# Patient Record
Sex: Female | Born: 1975 | Race: White | Hispanic: No | Marital: Single | Smoking: Current every day smoker
Health system: Southern US, Community
[De-identification: ages and names within clinical notes are randomized; demographics above are authoritative.]

## PROBLEM LIST (undated history)

## (undated) DIAGNOSIS — H6980 Other specified disorders of Eustachian tube, unspecified ear: Secondary | ICD-10-CM

## (undated) DIAGNOSIS — K219 Gastro-esophageal reflux disease without esophagitis: Secondary | ICD-10-CM

## (undated) DIAGNOSIS — J45909 Unspecified asthma, uncomplicated: Secondary | ICD-10-CM

## (undated) DIAGNOSIS — H6990 Unspecified Eustachian tube disorder, unspecified ear: Secondary | ICD-10-CM

## (undated) HISTORY — PX: CHOLECYSTECTOMY: SHX55

## (undated) HISTORY — DX: Unspecified asthma, uncomplicated: J45.909

## (undated) HISTORY — DX: Other specified disorders of Eustachian tube, unspecified ear: H69.80

## (undated) HISTORY — DX: Gastro-esophageal reflux disease without esophagitis: K21.9

## (undated) HISTORY — DX: Unspecified eustachian tube disorder, unspecified ear: H69.90

## (undated) HISTORY — PX: TUBAL LIGATION: SHX77

---

## 2007-02-01 ENCOUNTER — Ambulatory Visit: Payer: Self-pay | Admitting: General Surgery

## 2007-02-03 ENCOUNTER — Ambulatory Visit: Payer: Self-pay | Admitting: General Surgery

## 2007-02-11 ENCOUNTER — Emergency Department: Payer: Self-pay | Admitting: Emergency Medicine

## 2008-10-10 ENCOUNTER — Encounter
Admission: RE | Admit: 2008-10-10 | Discharge: 2008-10-10 | Payer: Self-pay | Admitting: Physical Medicine & Rehabilitation

## 2008-12-11 ENCOUNTER — Ambulatory Visit: Payer: Self-pay | Admitting: Family Medicine

## 2008-12-13 ENCOUNTER — Ambulatory Visit: Payer: Self-pay | Admitting: Family Medicine

## 2009-01-08 ENCOUNTER — Encounter: Payer: Self-pay | Admitting: Neurosurgery

## 2009-06-22 ENCOUNTER — Emergency Department: Payer: Self-pay | Admitting: Emergency Medicine

## 2012-06-24 ENCOUNTER — Emergency Department: Payer: Self-pay | Admitting: Internal Medicine

## 2012-06-24 LAB — COMPREHENSIVE METABOLIC PANEL
Albumin: 3.9 g/dL (ref 3.4–5.0)
Anion Gap: 8 (ref 7–16)
Calcium, Total: 9.2 mg/dL (ref 8.5–10.1)
Co2: 26 mmol/L (ref 21–32)
EGFR (African American): >60
EGFR (Non-African Amer.): >60
Glucose: 95 mg/dL (ref 65–99)
Osmolality: 282 (ref 275–301)
Potassium: 3.7 mmol/L (ref 3.5–5.1)
SGOT(AST): 21 U/L (ref 15–37)
Sodium: 143 mmol/L (ref 136–145)

## 2012-06-24 LAB — URINALYSIS, COMPLETE
Bacteria: NONE SEEN
Bilirubin,UR: NEGATIVE
Blood: NEGATIVE
RBC,UR: 3 /HPF (ref 0–5)
Squamous Epithelial: 16
WBC UR: 20 /HPF (ref 0–5)

## 2012-06-24 LAB — CBC
HCT: 45.3 % (ref 35.0–47.0)
MCV: 96 fL (ref 80–100)
RBC: 4.73 10*6/uL (ref 3.80–5.20)
RDW: 12.7 % (ref 11.5–14.5)
WBC: 15.7 10*3/uL — ABNORMAL HIGH (ref 3.6–11.0)

## 2012-06-25 ENCOUNTER — Ambulatory Visit: Payer: Self-pay | Admitting: Internal Medicine

## 2012-06-25 LAB — URINALYSIS, COMPLETE
Bilirubin,UR: NEGATIVE
Ketone: NEGATIVE
Protein: NEGATIVE
Specific Gravity: 1.01 (ref 1.003–1.030)

## 2013-12-12 ENCOUNTER — Encounter: Payer: Self-pay | Admitting: Podiatry

## 2013-12-12 ENCOUNTER — Ambulatory Visit (INDEPENDENT_AMBULATORY_CARE_PROVIDER_SITE_OTHER): Payer: Medicaid Other

## 2013-12-12 ENCOUNTER — Ambulatory Visit (INDEPENDENT_AMBULATORY_CARE_PROVIDER_SITE_OTHER): Payer: Medicaid Other | Admitting: Podiatry

## 2013-12-12 VITALS — BP 121/85 | HR 84 | Resp 16 | Ht 61.0 in | Wt 162.0 lb

## 2013-12-12 DIAGNOSIS — M79609 Pain in unspecified limb: Secondary | ICD-10-CM

## 2013-12-12 DIAGNOSIS — M722 Plantar fascial fibromatosis: Secondary | ICD-10-CM

## 2013-12-12 DIAGNOSIS — M79673 Pain in unspecified foot: Secondary | ICD-10-CM

## 2013-12-12 MED ORDER — TRIAMCINOLONE ACETONIDE 10 MG/ML IJ SUSP: 10.0000 mg | Freq: Once | INTRAMUSCULAR | Status: DC

## 2013-12-12 NOTE — Patient Instructions (Signed)
Plantar Fasciitis (Heel Spur Syndrome)  with Rehab  The plantar fascia is a fibrous, ligament-like, soft-tissue structure that spans the bottom of the foot. Plantar fasciitis is a condition that causes pain in the foot due to inflammation of the tissue.  SYMPTOMS   · Pain and tenderness on the underneath side of the foot.  · Pain that worsens with standing or walking.  CAUSES   Plantar fasciitis is caused by irritation and injury to the plantar fascia on the underneath side of the foot. Common mechanisms of injury include:  · Direct trauma to bottom of the foot.  · Damage to a small nerve that runs under the foot where the main fascia attaches to the heel bone.  · Stress placed on the plantar fascia due to bone spurs.  RISK INCREASES WITH:   · Activities that place stress on the plantar fascia (running, jumping, pivoting, or cutting).  · Poor strength and flexibility.  · Improperly fitted shoes.  · Tight calf muscles.  · Flat feet.  · Failure to warm-up properly before activity.  · Obesity.  PREVENTION  · Warm up and stretch properly before activity.  · Allow for adequate recovery between workouts.  · Maintain physical fitness:  · Strength, flexibility, and endurance.  · Cardiovascular fitness.  · Maintain a health body weight.  · Avoid stress on the plantar fascia.  · Wear properly fitted shoes, including arch supports for individuals who have flat feet.  PROGNOSIS   If treated properly, then the symptoms of plantar fasciitis usually resolve without surgery. However, occasionally surgery is necessary.  RELATED COMPLICATIONS   · Recurrent symptoms that may result in a chronic condition.  · Problems of the lower back that are caused by compensating for the injury, such as limping.  · Pain or weakness of the foot during push-off following surgery.  · Chronic inflammation, scarring, and partial or complete fascia tear, occurring more often from repeated injections.  TREATMENT   Treatment initially involves the use of  ice and medication to help reduce pain and inflammation. The use of strengthening and stretching exercises may help reduce pain with activity, especially stretches of the Achilles tendon. These exercises may be performed at home or with a therapist. Your caregiver may recommend that you use heel cups of arch supports to help reduce stress on the plantar fascia. Occasionally, corticosteroid injections are given to reduce inflammation. If symptoms persist for greater than 6 months despite non-surgical (conservative), then surgery may be recommended.   MEDICATION   · If pain medication is necessary, then nonsteroidal anti-inflammatory medications, such as aspirin and ibuprofen, or other minor pain relievers, such as acetaminophen, are often recommended.  · Do not take pain medication within 7 days before surgery.  · Prescription pain relievers may be given if deemed necessary by your caregiver. Use only as directed and only as much as you need.  · Corticosteroid injections may be given by your caregiver. These injections should be reserved for the most serious cases, because they may only be given a certain number of times.  HEAT AND COLD  · Cold treatment (icing) relieves pain and reduces inflammation. Cold treatment should be applied for 10 to 15 minutes every 2 to 3 hours for inflammation and pain and immediately after any activity that aggravates your symptoms. Use ice packs or massage the area with a piece of ice (ice massage).  · Heat treatment may be used prior to performing the stretching and strengthening activities prescribed   by your caregiver, physical therapist, or athletic trainer. Use a heat pack or soak the injury in warm water.  SEEK IMMEDIATE MEDICAL CARE IF:  · Treatment seems to offer no benefit, or the condition worsens.  · Any medications produce adverse side effects.  EXERCISES  RANGE OF MOTION (ROM) AND STRETCHING EXERCISES - Plantar Fasciitis (Heel Spur Syndrome)  These exercises may help you  when beginning to rehabilitate your injury. Your symptoms may resolve with or without further involvement from your physician, physical therapist or athletic trainer. While completing these exercises, remember:   · Restoring tissue flexibility helps normal motion to return to the joints. This allows healthier, less painful movement and activity.  · An effective stretch should be held for at least 30 seconds.  · A stretch should never be painful. You should only feel a gentle lengthening or release in the stretched tissue.  RANGE OF MOTION - Toe Extension, Flexion  · Sit with your right / left leg crossed over your opposite knee.  · Grasp your toes and gently pull them back toward the top of your foot. You should feel a stretch on the bottom of your toes and/or foot.  · Hold this stretch for __________ seconds.  · Now, gently pull your toes toward the bottom of your foot. You should feel a stretch on the top of your toes and or foot.  · Hold this stretch for __________ seconds.  Repeat __________ times. Complete this stretch __________ times per day.   RANGE OF MOTION - Ankle Dorsiflexion, Active Assisted  · Remove shoes and sit on a chair that is preferably not on a carpeted surface.  · Place right / left foot under knee. Extend your opposite leg for support.  · Keeping your heel down, slide your right / left foot back toward the chair until you feel a stretch at your ankle or calf. If you do not feel a stretch, slide your bottom forward to the edge of the chair, while still keeping your heel down.  · Hold this stretch for __________ seconds.  Repeat __________ times. Complete this stretch __________ times per day.   STRETCH  Gastroc, Standing  · Place hands on wall.  · Extend right / left leg, keeping the front knee somewhat bent.  · Slightly point your toes inward on your back foot.  · Keeping your right / left heel on the floor and your knee straight, shift your weight toward the wall, not allowing your back to  arch.  · You should feel a gentle stretch in the right / left calf. Hold this position for __________ seconds.  Repeat __________ times. Complete this stretch __________ times per day.  STRETCH  Soleus, Standing  · Place hands on wall.  · Extend right / left leg, keeping the other knee somewhat bent.  · Slightly point your toes inward on your back foot.  · Keep your right / left heel on the floor, bend your back knee, and slightly shift your weight over the back leg so that you feel a gentle stretch deep in your back calf.  · Hold this position for __________ seconds.  Repeat __________ times. Complete this stretch __________ times per day.  STRETCH  Gastrocsoleus, Standing   Note: This exercise can place a lot of stress on your foot and ankle. Please complete this exercise only if specifically instructed by your caregiver.   · Place the ball of your right / left foot on a step, keeping   your other foot firmly on the same step.  · Hold on to the wall or a rail for balance.  · Slowly lift your other foot, allowing your body weight to press your heel down over the edge of the step.  · You should feel a stretch in your right / left calf.  · Hold this position for __________ seconds.  · Repeat this exercise with a slight bend in your right / left knee.  Repeat __________ times. Complete this stretch __________ times per day.   STRENGTHENING EXERCISES - Plantar Fasciitis (Heel Spur Syndrome)   These exercises may help you when beginning to rehabilitate your injury. They may resolve your symptoms with or without further involvement from your physician, physical therapist or athletic trainer. While completing these exercises, remember:   · Muscles can gain both the endurance and the strength needed for everyday activities through controlled exercises.  · Complete these exercises as instructed by your physician, physical therapist or athletic trainer. Progress the resistance and repetitions only as guided.  STRENGTH - Towel  Curls  · Sit in a chair positioned on a non-carpeted surface.  · Place your foot on a towel, keeping your heel on the floor.  · Pull the towel toward your heel by only curling your toes. Keep your heel on the floor.  · If instructed by your physician, physical therapist or athletic trainer, add ____________________ at the end of the towel.  Repeat __________ times. Complete this exercise __________ times per day.  STRENGTH - Ankle Inversion  · Secure one end of a rubber exercise band/tubing to a fixed object (table, pole). Loop the other end around your foot just before your toes.  · Place your fists between your knees. This will focus your strengthening at your ankle.  · Slowly, pull your big toe up and in, making sure the band/tubing is positioned to resist the entire motion.  · Hold this position for __________ seconds.  · Have your muscles resist the band/tubing as it slowly pulls your foot back to the starting position.  Repeat __________ times. Complete this exercises __________ times per day.   Document Released: 09/14/2005 Document Revised: 12/07/2011 Document Reviewed: 12/27/2008  [employer]® Patient Information ©2014 [employer].

## 2013-12-12 NOTE — Progress Notes (Signed)
   Subjective:    Patient ID: Marilyn BuntingKristi Hendricks, female    DOB: 12-22-1975, 38 y.o.   MRN: [id number]  HPI Comments: N pain L right heel D 1 yr O sudden C same A standing, walking T 0  Foot Pain      Review of Systems  All other systems reviewed and are negative.       Objective:   Physical Exam        Assessment & Plan:

## 2013-12-14 NOTE — Progress Notes (Signed)
Subjective:     Patient ID: Marilyn BuntingKristi Hendricks, female   DOB: [birth year], 38 y.o.   MRN: [id number]  Foot Pain   patient states she is getting pain in her right heel which has been present for a while and has worsened recently. Has tried changes in activity levels and shoes   Review of Systems  All other systems reviewed and are negative.       Objective:   Physical Exam  Nursing note and vitals reviewed. Constitutional: She is oriented to person, place, and time.  Cardiovascular: Intact distal pulses.   Musculoskeletal: Normal range of motion.  Neurological: She is oriented to person, place, and time.  Skin: Skin is warm.   neurovascular status intact with pain in the plantar right heel at the insertion to the calcaneus with inflammation and fluid buildup noted. Muscle strength and range of motion within normal limits and arch height found to be normal     Assessment:     Plantar fasciitis right heel with inflammation and fluid buildup    Plan:     H&P and x-ray reviewed and today I injected the plantar fascia 3 mg Kenalog 5 mg Xylocaine Marcaine mixture and instructed on supportive shoe physical therapy and fascially brace which was placed on today reappoint if symptoms continue

## 2014-01-27 ENCOUNTER — Emergency Department: Payer: Self-pay | Admitting: Emergency Medicine

## 2014-02-21 ENCOUNTER — Institutional Professional Consult (permissible substitution): Payer: Self-pay | Admitting: Pulmonary Disease

## 2014-04-09 ENCOUNTER — Encounter: Payer: Self-pay | Admitting: Pulmonary Disease

## 2014-04-09 ENCOUNTER — Ambulatory Visit (INDEPENDENT_AMBULATORY_CARE_PROVIDER_SITE_OTHER): Payer: Medicaid Other | Admitting: Pulmonary Disease

## 2014-04-09 VITALS — BP 138/86 | HR 86 | Ht 61.5 in | Wt 155.0 lb

## 2014-04-09 DIAGNOSIS — Z720 Tobacco use: Secondary | ICD-10-CM

## 2014-04-09 DIAGNOSIS — F1210 Cannabis abuse, uncomplicated: Secondary | ICD-10-CM

## 2014-04-09 DIAGNOSIS — F17200 Nicotine dependence, unspecified, uncomplicated: Secondary | ICD-10-CM

## 2014-04-09 DIAGNOSIS — J449 Chronic obstructive pulmonary disease, unspecified: Secondary | ICD-10-CM

## 2014-04-09 DIAGNOSIS — J4489 Other specified chronic obstructive pulmonary disease: Secondary | ICD-10-CM | POA: Insufficient documentation

## 2014-04-09 NOTE — Assessment & Plan Note (Signed)
This is been age or cause for her ongoing cough and mucus production. I discussed this at length with her today. She has found it very difficult to quit smoking primarily do to weight gain.  Plan: -Call 1 800 quit now for free nicotine replacement

## 2014-04-09 NOTE — Patient Instructions (Signed)
Try Breo one puff daily no matter how you feel Stop smoking cigarettes You can call 1-800-QUIT-NOW to get free nicotine replacement from the state of [place]  Try to limit your marijuana use to 2-3 blunts a day  [facility] will [facility]  We will see you back in 3-4 weeks or sooner if needed

## 2014-04-09 NOTE — Assessment & Plan Note (Addendum)
Her chronic bronchitis is directly related to her ongoing tobacco and marijuana use. It does not sound like she has significant postnasal drip which is contributing to this problem. She may have acid reflux.  Is not clear to me if she has COPD or asthma this certainly she is at increased risk because of her ongoing tobacco use. Further, her symptoms could be consistent with this.  Plan: -I advised her at length quit smoking -Full pulmonary function test -Trial Breo -GERD lifestyle modification changes -Followup one month

## 2014-04-09 NOTE — Progress Notes (Signed)
Subjective:    Patient ID: Marilyn Hendricks, female    DOB: [birth year], 38 y.o.   MRN: [id number]  HPI  This is a very pleasant 38 year old female who has a past medical history significant for ongoing tobacco use who comes to our clinic today for cough and mucus production. She has smoked one half to one pack of cigarettes daily for at least the last 20 years. She also smokes 5 marijuana cigarettes ("blunts") a day. She has smoked as much marijuana for the last 9 years. Prior to that, she is to use inhaled cocaine a regular basis but she has not done that for many years.  She tells me that she is here to see me because she has ongoing cough with mucus production on a daily basis. Typically the phlegm is clear in appearance. She does not have shortness of breath with the exception of some very occasional dyspnea when chasing one of her 6 children. She's not had problems carrying in groceries or walking up a flight of stairs. She says that the cough is typically present all day long and is now worse in the morning or the evenings. Nothing seems to make it better or worse. She has tried various inhalers such as albuterol which do not help. She once tried Advair and this made her jittery so she stopped. She does not think it made a difference in her breathing.  Prior to this, she had never been told that she had a lung problem. She's never had to be treated for a respiratory infection as an outpatient or an inpatient that she is aware of.  Prior to my visit she went [place] her nose and throat and she was found to have evidence of possible acid reflux. She took a PPI and she said that after a month she was still coughing. She also notes that she quit smoking cigarettes for 10 days and around this didn't seem to make much of a difference with her cough.  Several years ago she was told that she had "a tumor on her lungs". A recent chest x-ray was noted to be clear. She never had a biopsy of this possible  tumor.  Past Medical History  Diagnosis Date  . Lung tumor   . Asthma   . ETD (eustachian tube dysfunction)   . GERD (gastroesophageal reflux disease)      Family History  Problem Relation Age of Onset  . Cancer Mother     breast  . Cancer Maternal Grandfather     prostate     History   Social History  . Marital Status: Single    Spouse Name: N/A    Number of Children: N/A  . Years of Education: N/A   Occupational History  . Not on file.   Social History Main Topics  . Smoking status: Current Every Day Smoker -- 0.50 packs/day for 26 years    Types: Cigarettes  . Smokeless tobacco: Never Used  . Alcohol Use: No  . Drug Use: 7.00 per week    Special: Marijuana     Comment: Smoked crack for 18 years, quit 9 years ago.  Smokes marijuana daily.  Marland Kitchen Sexual Activity: Not on file   Other Topics Concern  . Not on file   Social History Narrative  . No narrative on file     Allergies  Allergen Reactions  . Erythromycin Swelling     Outpatient Prescriptions Prior to Visit  Medication Sig Dispense Refill  .  Acetaminophen (TYLENOL PO) Take by mouth as needed.       Facility-Administered Medications Prior to Visit  Medication Dose Route Frequency Provider Last Rate Last Dose  . triamcinolone acetonide (KENALOG) 10 MG/ML injection 10 mg  10 mg Other Once Lenn SinkNorman S Regal, DPM            Review of Systems  Constitutional: Negative for fever and unexpected weight change.  HENT: Positive for congestion. Negative for dental problem, ear pain, nosebleeds, postnasal drip, rhinorrhea, sinus pressure, sneezing, sore throat and trouble swallowing.   Eyes: Negative for redness and itching.  Respiratory: Positive for cough and shortness of breath. Negative for chest tightness and wheezing.   Cardiovascular: Negative for palpitations and leg swelling.  Gastrointestinal: Negative for nausea and vomiting.  Genitourinary: Negative for dysuria.  Musculoskeletal: Negative for  joint swelling.  Skin: Negative for rash.  Neurological: Negative for headaches.  Hematological: Does not bruise/bleed easily.  Psychiatric/Behavioral: Negative for dysphoric mood. The patient is not nervous/anxious.        Objective:   Physical Exam Filed Vitals:   04/09/14 1143  BP: 138/86  Pulse: 86  Height: 5' 1.5" (1.562 m)  Weight: 155 lb (70.308 kg)  SpO2: 100%   RA  Gen: well appearing, no acute distress HEENT: NCAT, PERRL, EOMi, OP clear, neck supple without masses PULM: diminished air movement, no wheezing CV: RRR, no mgr, no JVD AB: BS+, soft, nontender, no hsm Ext: warm, no edema, no clubbing, no cyanosis Derm: no rash or skin breakdown Neuro: A&Ox4, CN II-XII intact, strength 5/5 in all 4 extremities  01/2014 CXR > ? Hyperinflation, no masses or infiltrate     Assessment & Plan:   Obstructive chronic bronchitis without exacerbation Her chronic bronchitis is directly related to her ongoing tobacco and marijuana use. It does not sound like she has significant postnasal drip which is contributing to this problem. She may have acid reflux.  Is not clear to me if she has COPD or asthma this certainly she is at increased risk because of her ongoing tobacco use. Further, her symptoms could be consistent with this.  Plan: -I advised her at length quit smoking -Full pulmonary function test -Trial Breo -GERD lifestyle modification changes -Followup one month  Tobacco abuse This is been age or cause for her ongoing cough and mucus production. I discussed this at length with her today. She has found it very difficult to quit smoking primarily do to weight gain.  Plan: -Call 1 800 quit now for free nicotine replacement  Marijuana abuse There is some debate in the literature about whether or not mild to moderate marijuana use can cause chronic bronchitis. However, she would be considered an extremely frequent user of marijuana and there is no such debate in that  situation. It is very clear that smoking as many marijuana cigarettes as she does today is associated with chronic bronchitis.  Plan: -I advised her to cut back or stop altogether -Check IgE level to see if she has evidence of ABPA this can sometimes be associated with marijuana use    Updated Medication List Outpatient Encounter Prescriptions as of 04/09/2014  Medication Sig  . Acetaminophen (TYLENOL PO) Take by mouth as needed.

## 2014-04-09 NOTE — Assessment & Plan Note (Signed)
There is some debate in the literature about whether or not mild to moderate marijuana use can cause chronic bronchitis. However, she would be considered an extremely frequent user of marijuana and there is no such debate in that situation. It is very clear that smoking as many marijuana cigarettes as she does today is associated with chronic bronchitis.  Plan: -I advised her to cut back or stop altogether -Check IgE level to see if she has evidence of ABPA this can sometimes be associated with marijuana use

## 2014-04-10 LAB — IGE: IgE (Immunoglobulin E), Serum: 95 IU/mL (ref 0.0–180.0)

## 2014-04-10 NOTE — Progress Notes (Signed)
Quick Note:  Spoke with pt, she is aware of results. Nothing further needed at this time. ______ 

## 2014-04-12 ENCOUNTER — Ambulatory Visit: Payer: Self-pay | Admitting: Pulmonary Disease

## 2014-04-12 LAB — PULMONARY FUNCTION TEST

## 2014-04-16 ENCOUNTER — Encounter: Payer: Self-pay | Admitting: Pulmonary Disease

## 2014-04-16 ENCOUNTER — Telehealth: Payer: Self-pay

## 2014-04-16 NOTE — Telephone Encounter (Signed)
Message copied by Velvet BatheAULFIELD, Baelynn Schmuhl L on Mon Apr 16, 2014  5:06 PM ------      Message from: Max FickleMCQUAID, DOUGLAS B      Created: Mon Apr 16, 2014  9:45 AM       A,      Please let her know that her PFTs were completely normal            Thanks      B ------

## 2014-04-16 NOTE — Telephone Encounter (Signed)
atc X1, na, nvm.  wcb

## 2014-04-18 NOTE — Telephone Encounter (Signed)
Spoke with pt, she is aware of results.  Nothing further needed at this time.  

## 2014-05-23 ENCOUNTER — Encounter: Payer: Self-pay | Admitting: Pulmonary Disease

## 2014-06-01 ENCOUNTER — Ambulatory Visit (INDEPENDENT_AMBULATORY_CARE_PROVIDER_SITE_OTHER): Payer: Medicaid Other | Admitting: Podiatry

## 2014-06-01 VITALS — BP 123/75 | HR 83 | Resp 16

## 2014-06-01 DIAGNOSIS — M722 Plantar fascial fibromatosis: Secondary | ICD-10-CM

## 2014-06-01 MED ORDER — TRIAMCINOLONE ACETONIDE 10 MG/ML IJ SUSP: 10.0000 mg | Freq: Once | INTRAMUSCULAR | Status: DC

## 2014-06-01 NOTE — Progress Notes (Signed)
Subjective:     Patient ID: Marilyn Hendricks, female   DOB: 07/24/1976, 38 y.o.   MRN: [id number]  HPI patient states my right heel has started to really hurt me again   Review of Systems     Objective:   Physical Exam Neurovascular status intact with exquisite discomfort at the insertion of the fascia into the calcaneus    Assessment:     Plantar fasciitis of the right heel    Plan:     Injected the right plantar fascia 3 mg Kenalog 5 mg Xylocaine and instructed on reduced activity and physical therapy

## 2015-01-15 ENCOUNTER — Encounter: Payer: Self-pay | Admitting: Podiatry

## 2015-01-15 ENCOUNTER — Ambulatory Visit (INDEPENDENT_AMBULATORY_CARE_PROVIDER_SITE_OTHER): Payer: Medicaid Other | Admitting: Podiatry

## 2015-01-15 VITALS — BP 111/79 | HR 87 | Resp 16 | Ht 61.5 in | Wt 152.0 lb

## 2015-01-15 DIAGNOSIS — M722 Plantar fascial fibromatosis: Secondary | ICD-10-CM

## 2015-01-15 MED ORDER — TRIAMCINOLONE ACETONIDE 10 MG/ML IJ SUSP
10.0000 mg | Freq: Once | INTRAMUSCULAR | Status: AC
  Administered 2015-01-15: 10 mg

## 2015-01-15 NOTE — Progress Notes (Signed)
Subjective:     Patient ID: Marilyn CapersKristi D Hendricks, female   DOB: 1976-08-08, 39 y.o.   MRN: [id number]  HPI patient presents with pain in the heels that has returned in last couple weeks but she did have 8 months of relief   Review of Systems     Objective:   Physical Exam Neurovascular status intact with discomfort plantar heel region bilateral that is still present upon deep palpation    Assessment:     Plantar fasciitis bilateral    Plan:     Injected the plantar fascia bilateral 3 [place] Kenalog 5 mg Xylocaine and advised on stretching exercises and supportive shoe gear

## 2015-06-02 ENCOUNTER — Emergency Department
Admission: EM | Admit: 2015-06-02 | Discharge: 2015-06-02 | Discharge status: Against Medical Advice | Payer: Medicaid Other | Attending: Emergency Medicine | Admitting: Emergency Medicine

## 2015-06-02 ENCOUNTER — Encounter: Payer: Self-pay | Admitting: Emergency Medicine

## 2015-06-02 DIAGNOSIS — F1210 Cannabis abuse, uncomplicated: Secondary | ICD-10-CM | POA: Insufficient documentation

## 2015-06-02 DIAGNOSIS — Z720 Tobacco use: Secondary | ICD-10-CM | POA: Diagnosis not present

## 2015-06-02 DIAGNOSIS — G43909 Migraine, unspecified, not intractable, without status migrainosus: Secondary | ICD-10-CM | POA: Insufficient documentation

## 2015-06-02 NOTE — ED Notes (Signed)
Pt reports slight headache for 3 days, worsening today; pain across the top of her shoulders and up into the back of her head; says this is her typical migraine; has increased fluid intake, taken  ibuprofen and flexeril (last around midnight) with no relief; pt admits to smoking marijuana earlier Saturday but didn't smoke any tonight "because my head hurt too bad"

## 2017-12-26 ENCOUNTER — Ambulatory Visit
Admission: EM | Admit: 2017-12-26 | Discharge: 2017-12-26 | Disposition: A | Discharge status: Home / Self Care | Payer: Self-pay | Attending: Emergency Medicine | Admitting: Emergency Medicine

## 2017-12-26 ENCOUNTER — Other Ambulatory Visit: Payer: Self-pay

## 2017-12-26 ENCOUNTER — Encounter: Payer: Self-pay | Admitting: Gynecology

## 2017-12-26 DIAGNOSIS — S39012A Strain of muscle, fascia and tendon of lower back, initial encounter: Secondary | ICD-10-CM

## 2017-12-26 DIAGNOSIS — S46811A Strain of other muscles, fascia and tendons at shoulder and upper arm level, right arm, initial encounter: Secondary | ICD-10-CM

## 2017-12-26 MED ORDER — METHOCARBAMOL 750 MG PO TABS: 750.0000 mg | ORAL_TABLET | ORAL | 0 refills | Status: DC

## 2017-12-26 MED ORDER — HYDROCODONE-ACETAMINOPHEN 5-325 MG PO TABS: 1.0000 | ORAL_TABLET | Freq: Four times a day (QID) | ORAL | 0 refills | Status: DC | PRN

## 2017-12-26 MED ORDER — IBUPROFEN 600 MG PO TABS: 600.0000 mg | ORAL_TABLET | Freq: Four times a day (QID) | ORAL | 0 refills | Status: DC | PRN

## 2017-12-26 NOTE — Discharge Instructions (Addendum)
People tend to feel worse over the next several days, but most people are back to normal in 1 week. A small number of people will have persistent pain for up to six weeks. Take the 600 mg of ibuprofen with the 1 g of Tylenol on a regular basis as directed.  Norco for severe pain only. Do not take the norco if you are taking the tylenol, as they both have tylenol in them, and too much can hurt your liver. Do not exceed 4 grams of tylenol per day from all sources.    Here is a list of primary care providers who are taking new patients:  Dr. Elizabeth Sauereanna Jones, Dr. Schuyler AmorWilliam Plonk [address] [place] [address] [phone]  [facility] [place] [address]  [place] [address]  [phone]  [facility] [address]  [place], [place] [address] [phone]  [facility] [place] [address]  [phone] [place], [place] [address]  Here are clinics/ other resources who will see you if you do not have insurance. Some have certain criteria that you must meet. Call them and find out what they are:  [facility]: [address] [place] [place], [place] [address] Phone: [phone] Hours: First and Third Saturdays of each Month, 9 a.m. - 1 p.m.  [facility]: [address], [place], [place] [address] Phone: [phone] Hours: Tuesday, 4 p.m. - 8 p.m. Thursday, 1 p.m. - 8 p.m. Wednesday, 9 a.m. - [facility]  [facility] [address], [place], [place] [address] Phone: [phone] Pharmacy Phone Number: [phone] Dental Phone Number: [phone] [facility] Insurance Help: [phone]  Dental Hours: Monday - Thursday, 8 a.m. - 6 p.m.  [facility] [address] [place], [place] [address] Phone: [phone] Pharmacy Phone Number: [phone] [facility] Insurance Help: [phone]  [facility] [address] [place], [place] [address] Phone: [phone] Pharmacy Phone  Number: [phone] [facility] Insurance Help: [phone]  [facility] [address] [place], [place] [address] Phone: [phone] [facility] Insurance Help: [phone]   [facility] [address] [place], [place] [address] Phone: [phone]  Go to www.goodrx.com to look up your medications. This will give you a list of where you can find your prescriptions at the most affordable prices. Or ask the pharmacist what the cash price is, or if they have any other discount programs available to help make your medication more affordable. This can be less expensive than what you would pay with insurance.
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
## 2018-01-25 ENCOUNTER — Ambulatory Visit: Payer: Self-pay | Admitting: General Surgery

## 2018-01-25 ENCOUNTER — Encounter: Payer: Self-pay | Admitting: General Surgery

## 2018-01-25 VITALS — BP 98/62 | HR 78 | Resp 16 | Ht 61.0 in | Wt 151.0 lb

## 2018-01-25 DIAGNOSIS — L729 Follicular cyst of the skin and subcutaneous tissue, unspecified: Secondary | ICD-10-CM

## 2018-01-25 DIAGNOSIS — Z1231 Encounter for screening mammogram for malignant neoplasm of breast: Secondary | ICD-10-CM

## 2018-01-25 NOTE — Progress Notes (Signed)
Patient ID: Marilyn Hendricks, female   DOB: 16-Aug-1976, 42 y.o.   MRN: [id number]  Chief Complaint  Patient presents with  . Follow-up    HPI [name] is a 42 y.o. female.  Here for evaluation of a left breast mass. She noticed it 3 weeks ago because she noticed bruising, doesn't remember any breast injury.. She states she has not been seen by a primary care physician in over 10 years. She does not do regular breast checks and no prior mammograms.   HPI  Past Medical History:  Diagnosis Date  . Asthma   . ETD (eustachian tube dysfunction)   . GERD (gastroesophageal reflux disease)     Past Surgical History:  Procedure Laterality Date  . CHOLECYSTECTOMY    . TUBAL LIGATION      Family History  Problem Relation Age of Onset  . Cancer Mother        breast  . Cancer Maternal Grandfather        prostate    Social History Social History   Tobacco Use  . Smoking status: Current Every Day Smoker    Packs/day: 0.50    Years: 26.00    Pack years: 13.00    Types: Cigarettes  . Smokeless tobacco: Never Used  Substance Use Topics  . Alcohol use: No  . Drug use: Yes    Frequency: 7.0 times per week    Types: Marijuana    Comment: Smoked crack for 18 years, quit 9 years ago.  Smokes marijuana daily.    Allergies  Allergen Reactions  . Erythromycin Swelling    Current Outpatient Medications  Medication Sig Dispense Refill  . acetaminophen (TYLENOL) 325 MG tablet Take 650 mg by mouth every 6 (six) hours as needed.     No current facility-administered medications for this visit.     Review of Systems Review of Systems  Constitutional: Negative.   Respiratory: Negative.   Cardiovascular: Negative.     Blood pressure 98/62, pulse 78, resp. rate 16, height  (1.549 m), weight 151 lb (68.5 kg), last menstrual period 01/22/2018.  Physical Exam Physical Exam  Constitutional: She is oriented to person, place, and time. She appears well-developed and  well-nourished.  Cardiovascular: Normal rate, regular rhythm and normal heart sounds.  Pulmonary/Chest: Effort normal and breath sounds normal.    Neurological: She is alert and oriented to person, place, and time.  Skin: Skin is warm and dry.    Data Reviewed None available.  Assessment    Benign clinical exam, family history of breast cancer.    Plan        Patient to see the BCCCP program. The patient is aware to call back for any questions or concerns.   HPI, Physical Exam, Assessment and Plan have been scribed under the direction and in the presence of Donnalee Curry, MD.  Ples Specter, CMA  I have completed the exam and reviewed the above documentation for accuracy and completeness.  I agree with the above.  Museum/gallery conservator has been used and any errors in dictation or transcription are unintentional.  Donnalee Curry, M.D., F.A.C.S.   [name] 01/26/2018, 5:48 AM

## 2018-01-25 NOTE — Patient Instructions (Signed)
    Patient to see the BCCCP program. The patient is aware to call back for any questions or concerns.

## 2018-01-26 DIAGNOSIS — Z1231 Encounter for screening mammogram for malignant neoplasm of breast: Secondary | ICD-10-CM | POA: Insufficient documentation

## 2018-01-26 DIAGNOSIS — L729 Follicular cyst of the skin and subcutaneous tissue, unspecified: Secondary | ICD-10-CM | POA: Insufficient documentation

## 2018-02-16 ENCOUNTER — Ambulatory Visit
Admission: RE | Admit: 2018-02-16 | Discharge: 2018-02-16 | Disposition: A | Discharge status: Home / Self Care | Payer: Self-pay | Source: Ambulatory Visit | Attending: General Surgery | Admitting: General Surgery

## 2018-02-16 DIAGNOSIS — Z1231 Encounter for screening mammogram for malignant neoplasm of breast: Secondary | ICD-10-CM | POA: Insufficient documentation

## 2018-03-17 ENCOUNTER — Encounter: Payer: Self-pay | Admitting: *Deleted

## 2018-03-21 ENCOUNTER — Ambulatory Visit: Payer: Self-pay | Attending: Oncology

## 2018-03-22 ENCOUNTER — Ambulatory Visit: Payer: Self-pay | Admitting: General Surgery

## 2018-06-02 ENCOUNTER — Encounter: Payer: Self-pay | Admitting: *Deleted

## 2019-05-18 ENCOUNTER — Ambulatory Visit
Admission: EM | Admit: 2019-05-18 | Discharge: 2019-05-18 | Disposition: A | Discharge status: Home / Self Care | Payer: Self-pay | Attending: Family Medicine | Admitting: Family Medicine

## 2019-05-18 ENCOUNTER — Encounter: Payer: Self-pay | Admitting: Emergency Medicine

## 2019-05-18 ENCOUNTER — Other Ambulatory Visit: Payer: Self-pay

## 2019-05-18 ENCOUNTER — Ambulatory Visit (INDEPENDENT_AMBULATORY_CARE_PROVIDER_SITE_OTHER): Payer: Self-pay

## 2019-05-18 DIAGNOSIS — M542 Cervicalgia: Secondary | ICD-10-CM

## 2019-05-18 DIAGNOSIS — M25512 Pain in left shoulder: Secondary | ICD-10-CM

## 2019-05-18 DIAGNOSIS — M62838 Other muscle spasm: Secondary | ICD-10-CM

## 2019-05-18 MED ORDER — MELOXICAM 15 MG PO TABS: 15.0000 mg | ORAL_TABLET | Freq: Every day | ORAL | 0 refills | Status: DC | PRN

## 2019-05-18 MED ORDER — TIZANIDINE HCL 4 MG PO TABS: 4.0000 mg | ORAL_TABLET | Freq: Three times a day (TID) | ORAL | 0 refills | Status: DC | PRN

## 2019-05-18 MED ORDER — KETOROLAC TROMETHAMINE 60 MG/2ML IM SOLN
60.0000 mg | Freq: Once | INTRAMUSCULAR | Status: AC
  Administered 2019-05-18: 60 mg via INTRAMUSCULAR

## 2019-05-18 NOTE — Discharge Instructions (Signed)
Rest.  Ice and heat.  Take care  Dr Lacinda Axon
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
## 2019-05-29 ENCOUNTER — Ambulatory Visit
Admission: EM | Admit: 2019-05-29 | Discharge: 2019-05-29 | Disposition: A | Discharge status: Home / Self Care | Payer: Self-pay | Attending: Family Medicine | Admitting: Family Medicine

## 2019-05-29 ENCOUNTER — Other Ambulatory Visit: Payer: Self-pay

## 2019-05-29 DIAGNOSIS — S161XXA Strain of muscle, fascia and tendon at neck level, initial encounter: Secondary | ICD-10-CM

## 2019-05-29 DIAGNOSIS — G44209 Tension-type headache, unspecified, not intractable: Secondary | ICD-10-CM
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
## 2019-08-04 ENCOUNTER — Other Ambulatory Visit: Payer: Self-pay

## 2019-08-04 ENCOUNTER — Ambulatory Visit: Payer: Self-pay | Admitting: Advanced Practice Midwife

## 2019-08-04 DIAGNOSIS — Z5320 Procedure and treatment not carried out because of patient's decision for unspecified reasons: Secondary | ICD-10-CM

## 2019-08-04 NOTE — Progress Notes (Signed)
Patient left without seeing provider. Aileen Fass, RN

## 2019-09-05 ENCOUNTER — Other Ambulatory Visit: Payer: Self-pay

## 2019-09-05 DIAGNOSIS — Z20822 Contact with and (suspected) exposure to covid-19: Secondary | ICD-10-CM

## 2019-09-07 LAB — NOVEL CORONAVIRUS, NAA: SARS-CoV-2, NAA: DETECTED — AB

## 2019-10-29 ENCOUNTER — Other Ambulatory Visit: Payer: Self-pay

## 2019-10-29 ENCOUNTER — Emergency Department
Admission: EM | Admit: 2019-10-29 | Discharge: 2019-10-29 | Disposition: A | Discharge status: Home / Self Care | Payer: 59 | Attending: Emergency Medicine | Admitting: Emergency Medicine

## 2019-10-29 DIAGNOSIS — R1013 Epigastric pain: Secondary | ICD-10-CM | POA: Diagnosis present

## 2019-10-29 DIAGNOSIS — F17210 Nicotine dependence, cigarettes, uncomplicated: Secondary | ICD-10-CM | POA: Diagnosis not present

## 2019-10-29 DIAGNOSIS — K2900 Acute gastritis without bleeding: Secondary | ICD-10-CM

## 2019-10-29 DIAGNOSIS — J45909 Unspecified asthma, uncomplicated: Secondary | ICD-10-CM | POA: Insufficient documentation

## 2019-10-29 LAB — COMPREHENSIVE METABOLIC PANEL
ALT: 71 U/L — ABNORMAL HIGH (ref 0–44)
AST: 104 U/L — ABNORMAL HIGH (ref 15–41)
Albumin: 4 g/dL (ref 3.5–5.0)
Alkaline Phosphatase: 69 U/L (ref 38–126)
Anion gap: 11 (ref 5–15)
BUN: 6 mg/dL (ref 6–20)
CO2: 22 mmol/L (ref 22–32)
Calcium: 8.9 mg/dL (ref 8.9–10.3)
Chloride: 105 mmol/L (ref 98–111)
Creatinine, Ser: 0.57 mg/dL (ref 0.44–1.00)
GFR calc Af Amer: >60 mL/min (ref >60)
GFR calc non Af Amer: >60 mL/min (ref >60)
Glucose, Bld: 99 mg/dL (ref 70–99)
Potassium: 3.8 mmol/L (ref 3.5–5.1)
Sodium: 138 mmol/L (ref 135–145)
Total Bilirubin: 1 mg/dL (ref 0.3–1.2)
Total Protein: 6.9 g/dL (ref 6.5–8.1)

## 2019-10-29 LAB — LIPASE, BLOOD: Lipase: 18 U/L (ref 11–51)

## 2019-10-29 LAB — CBC
HCT: 41.4 % (ref 36.0–46.0)
Hemoglobin: 14.4 g/dL (ref 12.0–15.0)
MCH: 33.3 pg (ref 26.0–34.0)
MCHC: 34.8 g/dL (ref 30.0–36.0)
MCV: 95.6 fL (ref 80.0–100.0)
Platelets: 244 10*3/uL (ref 150–400)
RBC: 4.33 MIL/uL (ref 3.87–5.11)
RDW: 13 % (ref 11.5–15.5)
WBC: 18.8 10*3/uL — ABNORMAL HIGH (ref 4.0–10.5)
nRBC: 0 % (ref 0.0–0.2)

## 2019-10-29 MED ORDER — SUCRALFATE 1 G PO TABS: 1.0000 g | ORAL_TABLET | Freq: Four times a day (QID) | ORAL | 0 refills | Status: DC

## 2019-10-29 MED ORDER — ONDANSETRON HCL 4 MG/2ML IJ SOLN
4.0000 mg | Freq: Once | INTRAMUSCULAR | Status: AC
  Administered 2019-10-29: 4 mg via INTRAVENOUS
  Filled 2019-10-29: qty 2

## 2019-10-29 MED ORDER — MORPHINE SULFATE (PF) 4 MG/ML IV SOLN: 4.0000 mg | Freq: Once | INTRAVENOUS | Status: DC

## 2019-10-29 MED ORDER — PANTOPRAZOLE SODIUM 20 MG PO TBEC: 20.0000 mg | DELAYED_RELEASE_TABLET | Freq: Every day | ORAL | 1 refills | Status: DC

## 2019-10-29 MED ORDER — MORPHINE SULFATE (PF) 4 MG/ML IV SOLN
4.0000 mg | Freq: Once | INTRAVENOUS | Status: AC
  Administered 2019-10-29: 4 mg via INTRAVENOUS
  Filled 2019-10-29: qty 1

## 2019-10-29 MED ORDER — LIDOCAINE VISCOUS HCL 2 % MT SOLN
15.0000 mL | Freq: Once | OROMUCOSAL | Status: AC
  Administered 2019-10-29: 15 mL via ORAL
  Filled 2019-10-29: qty 15

## 2019-10-29 MED ORDER — ONDANSETRON HCL 4 MG/2ML IJ SOLN: 4.0000 mg | Freq: Once | INTRAMUSCULAR | Status: DC

## 2019-10-29 MED ORDER — ALUM & MAG HYDROXIDE-SIMETH 200-200-20 MG/5ML PO SUSP
30.0000 mL | Freq: Once | ORAL | Status: AC
  Administered 2019-10-29: 30 mL via ORAL
  Filled 2019-10-29: qty 30

## 2019-10-29 NOTE — ED Provider Notes (Signed)
[facility] Department Provider Note   ____________________________________________    I have reviewed the triage vital signs and the nursing notes.   HISTORY  Chief Complaint Abdominal Pain     HPI Marilyn Hendricks is a 44 y.o. female who presents with complaints of epigastric pain which started at approximately 4:30 AM this morning.  She reports she did eat [place] food yesterday.  She has had some nausea but no vomiting.  Normal stools.  She has had a cholecystectomy.  Denies alcohol use.  Does smoke cigarettes.  No history of pancreatitis in the past.  Has not seen a doctor in 10 years per patient.  Has not take anything for this.  Describes the pain is burning in nature.  Past Medical History:  Diagnosis Date  . Asthma   . ETD (eustachian tube dysfunction)   . GERD (gastroesophageal reflux disease)     Patient Active Problem List   Diagnosis Date Noted  . Cyst of skin 01/26/2018  . Encounter for screening mammogram for breast cancer 01/26/2018  . Obstructive chronic bronchitis without exacerbation ([place]) 04/09/2014  . Tobacco abuse 04/09/2014  . Marijuana abuse 04/09/2014    Past Surgical History:  Procedure Laterality Date  . CHOLECYSTECTOMY    . TUBAL LIGATION      Prior to Admission medications   Medication Sig Start Date End Date Taking? Authorizing Provider  acetaminophen (TYLENOL) 325 MG tablet Take 650 mg by mouth every 6 (six) hours as needed.    [provider]  meloxicam (MOBIC) 15 MG tablet Take 1 tablet (15 mg total) by mouth daily as needed for pain. 05/18/19   Coral Spikes, DO  pantoprazole (PROTONIX) 20 MG tablet Take 1 tablet (20 mg total) by mouth daily. 10/29/19 10/28/20  Lavonia Drafts, MD  sucralfate (CARAFATE) 1 g tablet Take 1 tablet (1 g total) by mouth 4 (four) times daily for 15 days. 10/29/19 11/13/19  Lavonia Drafts, MD  tiZANidine (ZANAFLEX) 4 MG tablet Take 1 tablet (4 mg total) by mouth every  8 (eight) hours as needed for muscle spasms. 05/18/19   Coral Spikes, DO     Allergies Beeswax, Carrot [daucus carota], Cats claw [uncaria tomentosa (cats claw)], and Erythromycin  Family History  Problem Relation Age of Onset  . Breast cancer Mother 60       double mastectomy in 1994  . Cancer Maternal Grandfather        prostate    Social History Social History   Tobacco Use  . Smoking status: Current Every Day Smoker    Packs/day: 0.50    Years: 26.00    Pack years: 13.00    Types: Cigarettes  . Smokeless tobacco: Never Used  Substance Use Topics  . Alcohol use: No  . Drug use: Yes    Frequency: 7.0 times per week    Types: Marijuana    Comment: last night used    Review of Systems  Constitutional: No fever/chills Eyes: No visual changes.  ENT: No burning in throat Cardiovascular: No chest pain Respiratory: No cough Gastrointestinal: As above Genitourinary: Negative for dysuria. Musculoskeletal: Negative for back pain. Skin: Negative for rash. Neurological: Negative for headaches    ____________________________________________   PHYSICAL EXAM:  VITAL SIGNS: ED Triage Vitals  Enc Vitals Group     BP 10/29/19 1415 120/86     Pulse Rate 10/29/19 1415 90     Resp 10/29/19 1415 18  Temp 10/29/19 1415 98.1 F (36.7 C)     Temp Source 10/29/19 1415 Oral     SpO2 10/29/19 1415 97 %     Weight 10/29/19 1416 68.9 kg (152 lb)     Height 10/29/19 1416 1.549 m (5\' 1" )     Head Circumference --      Peak Flow --      Pain Score 10/29/19 1416 10     Pain Loc --      Pain Edu? --      Excl. in GC? --     Constitutional: Alert and oriented.   Nose: No congestion/rhinnorhea. Mouth/Throat: Mucous membranes are moist.    Cardiovascular: Normal rate, regular rhythm. Grossly normal heart sounds.  Good peripheral circulation. Respiratory: Normal respiratory effort.  No retractions. Lungs CTAB. Gastrointestinal: Soft, mild tenderness in epigastrium.  No  distention.  No CVA tenderness.  Musculoskeletal Warm and well perfused Neurologic:  Normal speech and language. No gross focal neurologic deficits are appreciated.  Skin:  Skin is warm, dry and intact. No rash noted. Psychiatric: Mood and affect are normal. Speech and behavior are normal.  ____________________________________________   LABS (all labs ordered are listed, but only abnormal results are displayed)  Labs Reviewed  COMPREHENSIVE METABOLIC PANEL - Abnormal; Notable for the following components:      Result Value   AST 104 (*)    ALT 71 (*)    All other components within normal limits  CBC - Abnormal; Notable for the following components:   WBC 18.8 (*)    All other components within normal limits  LIPASE, BLOOD  URINALYSIS, COMPLETE (UACMP) WITH MICROSCOPIC  POC URINE PREG, ED   ____________________________________________  EKG  None ____________________________________________  RADIOLOGY   ____________________________________________   PROCEDURES  Procedure(s) performed: No  Procedures   Critical Care performed: No ____________________________________________   INITIAL IMPRESSION / ASSESSMENT AND PLAN / ED COURSE  Pertinent labs & imaging results that were available during my care of the patient were reviewed by me and considered in my medical decision making (see chart for details).  Patient well-appearing and in no acute distress.  Mild tenderness in epigastrium, suspicious for gastritis/PUD, history of cholecystectomy, lipase is normal.  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  We will treat with GI cocktail, IV morphine, IV Zofran and reevaluate.  Patient with significant provement after treatment.  Lab work overall reassuring besides chronically elevated white blood cell count.  Mild elevation in LFTs noted she will require follow-up for this.  She does not have a PCP,  will refer her.  Suspect gastritis given HPI and work-up, will start her on Protonix, Carafate, return precautions, outpatient follow-up    ____________________________________________   FINAL CLINICAL IMPRESSION(S) / ED DIAGNOSES  Final diagnoses:  Acute gastritis without hemorrhage, unspecified gastritis type        Note:  This document was prepared using Dragon voice recognition software and may include unintentional dictation errors.   10/31/19, MD 10/29/19 1729

## 2019-10-29 NOTE — ED Triage Notes (Signed)
FIRST NURSE NOTE - here for abdominal pain starting today.  Has also had face pain for couple days.

## 2019-10-29 NOTE — ED Triage Notes (Signed)
Pt reports Friday pm she started with right sided face pain with swollen lymph nodes - states she has "sinus infection" Pt reports this am she started with mid epigastric pain - Denies dysuria or N/D

## 2019-11-08 ENCOUNTER — Other Ambulatory Visit: Payer: Self-pay | Admitting: Otolaryngology

## 2019-11-08 DIAGNOSIS — R591 Generalized enlarged lymph nodes: Secondary | ICD-10-CM

## 2019-11-08 DIAGNOSIS — R599 Enlarged lymph nodes, unspecified: Secondary | ICD-10-CM

## 2019-11-23 ENCOUNTER — Ambulatory Visit
Admission: RE | Admit: 2019-11-23 | Discharge: 2019-11-23 | Disposition: A | Discharge status: Home / Self Care | Payer: 59 | Source: Ambulatory Visit | Attending: Otolaryngology | Admitting: Otolaryngology

## 2019-11-23 ENCOUNTER — Other Ambulatory Visit: Payer: Self-pay

## 2019-11-23 DIAGNOSIS — R599 Enlarged lymph nodes, unspecified: Secondary | ICD-10-CM | POA: Diagnosis present

## 2019-11-23 DIAGNOSIS — R591 Generalized enlarged lymph nodes: Secondary | ICD-10-CM

## 2019-11-23 MED ORDER — IOHEXOL 300 MG/ML  SOLN
75.0000 mL | Freq: Once | INTRAMUSCULAR | Status: AC | PRN
  Administered 2019-11-23: 10:00:00 75 mL via INTRAVENOUS
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
## 2021-08-13 ENCOUNTER — Encounter: Payer: Self-pay | Admitting: Family Medicine

## 2021-08-13 ENCOUNTER — Ambulatory Visit (LOCAL_COMMUNITY_HEALTH_CENTER): Payer: Self-pay | Admitting: Family Medicine

## 2021-08-13 ENCOUNTER — Other Ambulatory Visit: Payer: Self-pay

## 2021-08-13 NOTE — Progress Notes (Signed)
Pt declined need for STI visit.  Not seen by provider  Wendi Snipes, FNP

## 2021-08-13 NOTE — Progress Notes (Signed)
Patient was unable to be seen through family planning. Tubes tied in 2003. Patient verbalized understanding. [facility] information given to patient.

## 2022-07-08 ENCOUNTER — Ambulatory Visit
Admission: EM | Admit: 2022-07-08 | Discharge: 2022-07-08 | Disposition: A | Discharge status: Home / Self Care | Payer: 59 | Attending: Emergency Medicine | Admitting: Emergency Medicine

## 2022-07-08 DIAGNOSIS — Z1152 Encounter for screening for COVID-19: Secondary | ICD-10-CM | POA: Diagnosis not present

## 2022-07-08 DIAGNOSIS — R519 Headache, unspecified: Secondary | ICD-10-CM | POA: Insufficient documentation

## 2022-07-08 DIAGNOSIS — R112 Nausea with vomiting, unspecified: Secondary | ICD-10-CM | POA: Insufficient documentation

## 2022-07-08 DIAGNOSIS — J069 Acute upper respiratory infection, unspecified: Secondary | ICD-10-CM | POA: Diagnosis present

## 2022-07-08 LAB — SARS CORONAVIRUS 2 BY RT PCR: SARS Coronavirus 2 by RT PCR: NEGATIVE

## 2022-07-08 MED ORDER — KETOROLAC TROMETHAMINE 60 MG/2ML IM SOLN
60.0000 mg | Freq: Once | INTRAMUSCULAR | Status: AC
  Administered 2022-07-08: 60 mg via INTRAMUSCULAR

## 2022-07-08 MED ORDER — ONDANSETRON 8 MG PO TBDP
8.0000 mg | ORAL_TABLET | Freq: Once | ORAL | Status: AC
  Administered 2022-07-08: 8 mg via ORAL

## 2022-07-08 MED ORDER — KETOROLAC TROMETHAMINE 10 MG PO TABS: 10.0000 mg | ORAL_TABLET | Freq: Four times a day (QID) | ORAL | 0 refills | Status: DC | PRN

## 2022-07-08 MED ORDER — ONDANSETRON 8 MG PO TBDP: 8.0000 mg | ORAL_TABLET | Freq: Three times a day (TID) | ORAL | 0 refills | Status: DC | PRN

## 2022-07-08 NOTE — ED Provider Notes (Signed)
[facility]    CSN: [id number] Arrival date & time: 07/08/22  1740      History   Chief Complaint Chief Complaint  Patient presents with   Emesis   Headache    HPI Marilyn Hendricks is a 46 y.o. female.   HPI  46 year old female here for evaluation of headache and vomiting.  Patient reports that she has been experiencing a headache all day today that is in the frontal region.  She states that she does have a history of migraines but she has not had one in a long time however this is a typical pattern for her previous migraines.  She states that typically she takes Tylenol and her headache resolves but that has not helped this time.  She took some Alka-Seltzer cold and sinus plus and developed vomiting as well.  She has been able to keep down some fluids but she continues to be nauseous.  She denies any runny nose, nasal congestion, ear pain, sore throat, or cough.  Past Medical History:  Diagnosis Date   ETD (eustachian tube dysfunction)    GERD (gastroesophageal reflux disease)     Patient Active Problem List   Diagnosis Date Noted   Cyst of skin 01/26/2018   Encounter for screening mammogram for breast cancer 01/26/2018   Obstructive chronic bronchitis without exacerbation 04/09/2014   Tobacco abuse 04/09/2014   Marijuana abuse 04/09/2014    Past Surgical History:  Procedure Laterality Date   CHOLECYSTECTOMY     TUBAL LIGATION      OB History     Gravida  5   Para  3   Term      Preterm      AB  1   Living  3      SAB  1   IAB      Ectopic      Multiple  3   Live Births  3        Obstetric Comments  1st Menstrual Cycle:  12  1st Pregnancy:  14           Home Medications    Prior to Admission medications   Medication Sig Start Date End Date Taking? Authorizing Provider  ketorolac (TORADOL) 10 MG tablet Take 1 tablet (10 mg total) by mouth every 6 (six) hours as needed. 07/08/22  Yes Becky Augusta, NP  ondansetron  (ZOFRAN-ODT) 8 MG disintegrating tablet Take 1 tablet (8 mg total) by mouth every 8 (eight) hours as needed for nausea or vomiting. 07/08/22  Yes Becky Augusta, NP  acetaminophen (TYLENOL) 325 MG tablet Take 650 mg by mouth every 6 (six) hours as needed.    [provider]  pantoprazole (PROTONIX) 20 MG tablet Take 1 tablet (20 mg total) by mouth daily. 10/29/19 10/28/20  Jene Every, MD  sucralfate (CARAFATE) 1 g tablet Take 1 tablet (1 g total) by mouth 4 (four) times daily for 15 days. 10/29/19 11/13/19  Jene Every, MD  tiZANidine (ZANAFLEX) 4 MG tablet Take 1 tablet (4 mg total) by mouth every 8 (eight) hours as needed for muscle spasms. Patient not taking: Reported on 08/13/2021 05/18/19   Tommie Sams, DO    Family History Family History  Problem Relation Age of Onset   Cancer Maternal Grandfather        prostate   Esophagitis Father    Breast cancer Mother 27       double mastectomy in 58   Asthma Mother  Social History Social History   Tobacco Use   Smoking status: Every Day    Packs/day: 0.50    Years: 26.00    Total pack years: 13.00    Types: Cigarettes   Smokeless tobacco: Never  Vaping Use   Vaping Use: Some days   Substances: Flavoring  Substance Use Topics   Alcohol use: No   Drug use: Yes    Frequency: 7.0 times per week    Types: Marijuana    Comment: last night used     Allergies   Beeswax, Carrot [daucus carota], Cats claw [uncaria tomentosa (cats claw)], and Erythromycin   Review of Systems Review of Systems  Constitutional:  Negative for fever.  HENT:  Negative for congestion, ear pain, rhinorrhea and sore throat.   Respiratory:  Negative for cough.   Gastrointestinal:  Positive for nausea and vomiting.  Musculoskeletal:  Positive for arthralgias and myalgias.  Neurological:  Positive for headaches.     Physical Exam Triage Vital Signs ED Triage Vitals  Enc Vitals Group     BP 07/08/22 1815 (!) 145/98     Pulse Rate  07/08/22 1815 65     Resp --      Temp 07/08/22 1815 97.6 F (36.4 C)     Temp Source 07/08/22 1815 Oral     SpO2 07/08/22 1815 99 %     Weight 07/08/22 1813 171 lb (77.6 kg)     Height 07/08/22 1813 5' 1.5" (1.562 m)     Head Circumference --      Peak Flow --      Pain Score 07/08/22 1813 7     Pain Loc --      Pain Edu? --      Excl. in [place]? --    No data found.  Updated Vital Signs BP (!) 145/98 (BP Location: Left Arm)   Pulse 65   Temp 97.6 F (36.4 C) (Oral)   Ht 5' 1.5" (1.562 m)   Wt 171 lb (77.6 kg)   LMP 08/08/2021 (Approximate)   SpO2 99%   BMI 31.79 kg/m   Visual Acuity Right Eye Distance:   Left Eye Distance:   Bilateral Distance:    Right Eye Near:   Left Eye Near:    Bilateral Near:     Physical Exam Vitals and nursing note reviewed.  Constitutional:      Appearance: Normal appearance. She is not ill-appearing.  HENT:     Head: Normocephalic and atraumatic.     Right Ear: Tympanic membrane, ear canal and external ear normal. There is no impacted cerumen.     Left Ear: Tympanic membrane, ear canal and external ear normal. There is no impacted cerumen.     Nose: Congestion and rhinorrhea present.     Mouth/Throat:     Mouth: Mucous membranes are moist.     Pharynx: Oropharynx is clear. No oropharyngeal exudate or posterior oropharyngeal erythema.  Cardiovascular:     Rate and Rhythm: Normal rate and regular rhythm.     Pulses: Normal pulses.     Heart sounds: Normal heart sounds. No murmur heard.    No friction rub. No gallop.  Pulmonary:     Effort: Pulmonary effort is normal.     Breath sounds: Normal breath sounds. No wheezing, rhonchi or rales.  Musculoskeletal:     Cervical back: Normal range of motion and neck supple.  Skin:    General: Skin is warm and dry.  Capillary Refill: Capillary refill takes less than 2 seconds.     Findings: No rash.  Neurological:     General: No focal deficit present.     Mental Status: She is alert and  oriented to person, place, and time.  Psychiatric:        Mood and Affect: Mood normal.        Behavior: Behavior normal.        Thought Content: Thought content normal.        Judgment: Judgment normal.      UC Treatments / Results  Labs (all labs ordered are listed, but only abnormal results are displayed) Labs Reviewed  SARS CORONAVIRUS 2 BY RT PCR    EKG   Radiology No results found.  Procedures Procedures (including critical care time)  Medications Ordered in UC Medications  ondansetron (ZOFRAN-ODT) disintegrating tablet 8 mg (8 mg Oral Given 07/08/22 1854)  ketorolac (TORADOL) injection 60 mg (60 mg Intramuscular Given 07/08/22 1854)    Initial Impression / Assessment and Plan / UC Course  I have reviewed the triage vital signs and the nursing notes.  Pertinent labs & imaging results that were available during my care of the patient were reviewed by me and considered in my medical decision making (see chart for details).   Patient is a nontoxic-appearing 45 year old female here for evaluation of headache and vomiting that started today as outlined HPI above.  On exam patient is erythematous edematous nasal mucosa with clear discharge in both nares.  Oropharyngeal exam is benign.  Cardiopulmonary exam reveals clear lung sounds in all fields.  Patient reports that she has a history of headaches that typically respond to Tylenol that presented for similar pattern to what she is experiencing today that this headache has not responded to Tylenol.  She is also had nausea and vomiting that does not typically accompany her headaches.  Given the upper respiratory inflammation found on her physical exam I will order a COVID PCR.  I have also ordered an injection of Toradol to help with her headache pain and ODT Zofran to help with nausea.  COVID PCR is negative.  Patient reports that following the Toradol her headache has improved some.  Zofran is also help with the nausea.  I  will discharge her home on Toradol tablets and Zofran ODT to help with nausea and headache going forward.  She also has signs of an upper respiratory infection which I believe is viral and will have to run its course.  Return precautions reviewed.   Final Clinical Impressions(s) / UC Diagnoses   Final diagnoses:  Acute upper respiratory infection  Acute nonintractable headache, unspecified headache type  Nausea and vomiting, unspecified vomiting type     Discharge Instructions      Go home and rest in a cool environment that is dark to help resolve the remainder of your headache.  You may use the Toradol every 6 hours with food as needed for your headache.  Use the Zofran every 8 hours as needed for nausea.  Return for reevaluation for any new or worsening symptoms.     ED Prescriptions     Medication Sig Dispense Auth. Provider   ketorolac (TORADOL) 10 MG tablet Take 1 tablet (10 mg total) by mouth every 6 (six) hours as needed. 20 tablet Becky Augusta, NP   ondansetron (ZOFRAN-ODT) 8 MG disintegrating tablet Take 1 tablet (8 mg total) by mouth every 8 (eight) hours as needed for nausea or vomiting.  20 tablet Becky Augusta, NP      PDMP not reviewed this encounter.   Becky Augusta, NP 07/08/22 1930

## 2022-07-08 NOTE — ED Triage Notes (Signed)
Pt c/o headache onset this morning, pt states she did take some alka seltzer cold & flu, emesis.

## 2022-07-08 NOTE — Discharge Instructions (Addendum)
Go home and rest in a cool environment that is dark to help resolve the remainder of your headache.  You may use the Toradol every 6 hours with food as needed for your headache.  Use the Zofran every 8 hours as needed for nausea.  Return for reevaluation for any new or worsening symptoms.

## 2022-10-02 ENCOUNTER — Ambulatory Visit
Admission: EM | Admit: 2022-10-02 | Discharge: 2022-10-02 | Disposition: A | Discharge status: Home / Self Care | Payer: 59 | Attending: Physician Assistant | Admitting: Physician Assistant

## 2022-10-02 ENCOUNTER — Encounter: Payer: Self-pay | Admitting: Emergency Medicine

## 2022-10-02 DIAGNOSIS — B349 Viral infection, unspecified: Secondary | ICD-10-CM

## 2022-10-02 DIAGNOSIS — R509 Fever, unspecified: Secondary | ICD-10-CM | POA: Diagnosis present

## 2022-10-02 DIAGNOSIS — R519 Headache, unspecified: Secondary | ICD-10-CM | POA: Diagnosis present

## 2022-10-02 DIAGNOSIS — Z1152 Encounter for screening for COVID-19: Secondary | ICD-10-CM | POA: Insufficient documentation

## 2022-10-02 LAB — SARS CORONAVIRUS 2 BY RT PCR: SARS Coronavirus 2 by RT PCR: NEGATIVE

## 2022-10-02 MED ORDER — KETOROLAC TROMETHAMINE 30 MG/ML IJ SOLN
30.0000 mg | Freq: Once | INTRAMUSCULAR | Status: AC
  Administered 2022-10-02: 30 mg via INTRAMUSCULAR

## 2022-10-02 MED ORDER — ONDANSETRON 8 MG PO TBDP: 8.0000 mg | ORAL_TABLET | Freq: Three times a day (TID) | ORAL | 0 refills | Status: DC | PRN

## 2022-10-02 MED ORDER — KETOROLAC TROMETHAMINE 10 MG PO TABS: 10.0000 mg | ORAL_TABLET | Freq: Four times a day (QID) | ORAL | 0 refills | Status: DC | PRN

## 2022-10-02 NOTE — ED Provider Notes (Signed)
[facility]    CSN: [id number] Arrival date & time: 10/02/22  0801      History   Chief Complaint Chief Complaint  Patient presents with   Headache   Chills    HPI Marilyn Hendricks is a 47 y.o. female.   Patient presents for evaluation of subjective fever, chills, body aches, right-sided ear pain, sore throat and headaches present for 4 days.  Known sick contacts.  Decreased appetite but tolerating some food and liquids.  Has attempted use of Tylenol, ketorolac.  Denies cough, shortness of breath or wheeze.   Past Medical History:  Diagnosis Date   ETD (eustachian tube dysfunction)    GERD (gastroesophageal reflux disease)     Patient Active Problem List   Diagnosis Date Noted   Cyst of skin 01/26/2018   Encounter for screening mammogram for breast cancer 01/26/2018   Obstructive chronic bronchitis without exacerbation 04/09/2014   Tobacco abuse 04/09/2014   Marijuana abuse 04/09/2014    Past Surgical History:  Procedure Laterality Date   CHOLECYSTECTOMY     TUBAL LIGATION      OB History     Gravida  5   Para  3   Term      Preterm      AB  1   Living  3      SAB  1   IAB      Ectopic      Multiple  3   Live Births  3        Obstetric Comments  1st Menstrual Cycle:  12  1st Pregnancy:  14           Home Medications    Prior to Admission medications   Medication Sig Start Date End Date Taking? Authorizing Provider  acetaminophen (TYLENOL) 325 MG tablet Take 650 mg by mouth every 6 (six) hours as needed.   Yes [provider]  ketorolac (TORADOL) 10 MG tablet Take 1 tablet (10 mg total) by mouth every 6 (six) hours as needed. 07/08/22   Margarette Canada, NP  ondansetron (ZOFRAN-ODT) 8 MG disintegrating tablet Take 1 tablet (8 mg total) by mouth every 8 (eight) hours as needed for nausea or vomiting. 07/08/22   Margarette Canada, NP  pantoprazole (PROTONIX) 20 MG tablet Take 1 tablet (20 mg total) by mouth daily. 10/29/19  10/28/20  Lavonia Drafts, MD  sucralfate (CARAFATE) 1 g tablet Take 1 tablet (1 g total) by mouth 4 (four) times daily for 15 days. 10/29/19 11/13/19  Lavonia Drafts, MD  tiZANidine (ZANAFLEX) 4 MG tablet Take 1 tablet (4 mg total) by mouth every 8 (eight) hours as needed for muscle spasms. Patient not taking: Reported on 08/13/2021 05/18/19   Coral Spikes, DO    Family History Family History  Problem Relation Age of Onset   Cancer Maternal Grandfather        prostate   Esophagitis Father    Breast cancer Mother 79       double mastectomy in 78   Asthma Mother     Social History Social History   Tobacco Use   Smoking status: Every Day    Packs/day: 0.50    Years: 26.00    Total pack years: 13.00    Types: Cigarettes   Smokeless tobacco: Never  Vaping Use   Vaping Use: Some days   Substances: Flavoring  Substance Use Topics   Alcohol use: No   Drug use: Yes  Frequency: 7.0 times per week    Types: Marijuana    Comment: last night used     Allergies   Beeswax, Carrot [daucus carota], Cats claw [uncaria tomentosa (cats claw)], and Erythromycin   Review of Systems Review of Systems  Constitutional:  Positive for chills and fever. Negative for activity change, appetite change, diaphoresis, fatigue and unexpected weight change.  HENT:  Positive for ear pain and sore throat. Negative for congestion, dental problem, drooling, ear discharge, facial swelling, hearing loss, mouth sores, nosebleeds, postnasal drip, rhinorrhea, sinus pressure, sinus pain, sneezing, tinnitus, trouble swallowing and voice change.   Respiratory: Negative.    Cardiovascular: Negative.   Gastrointestinal: Negative.   Musculoskeletal:  Positive for myalgias. Negative for arthralgias, back pain, gait problem, joint swelling, neck pain and neck stiffness.  Neurological:  Positive for headaches. Negative for dizziness, tremors, seizures, syncope, facial asymmetry, speech difficulty, weakness,  light-headedness and numbness.     Physical Exam Triage Vital Signs ED Triage Vitals  Enc Vitals Group     BP 10/02/22 0820 114/79     Pulse Rate 10/02/22 0820 71     Resp 10/02/22 0820 14     Temp 10/02/22 0820 98 F (36.7 C)     Temp Source 10/02/22 0820 Oral     SpO2 10/02/22 0820 96 %     Weight 10/02/22 0817 171 lb 1.2 oz (77.6 kg)     Height 10/02/22 0817 5' 1.5" (1.562 m)     Head Circumference --      Peak Flow --      Pain Score 10/02/22 0817 8     Pain Loc --      Pain Edu? --      Excl. in [place]? --    No data found.  Updated Vital Signs BP 114/79 (BP Location: Left Arm)   Pulse 71   Temp 98 F (36.7 C) (Oral)   Resp 14   Ht 5' 1.5" (1.562 m)   Wt 171 lb 1.2 oz (77.6 kg)   SpO2 96%   BMI 31.80 kg/m   Visual Acuity Right Eye Distance:   Left Eye Distance:   Bilateral Distance:    Right Eye Near:   Left Eye Near:    Bilateral Near:     Physical Exam Constitutional:      Appearance: Normal appearance. She is well-developed.  HENT:     Head: Normocephalic.     Right Ear: Tympanic membrane, ear canal and external ear normal.     Left Ear: Tympanic membrane, ear canal and external ear normal.     Nose: No congestion or rhinorrhea.     Mouth/Throat:     Mouth: Mucous membranes are moist.     Pharynx: Posterior oropharyngeal erythema present.  Eyes:     Extraocular Movements: Extraocular movements intact.  Cardiovascular:     Rate and Rhythm: Normal rate and regular rhythm.     Pulses: Normal pulses.     Heart sounds: Normal heart sounds.  Pulmonary:     Effort: Pulmonary effort is normal.     Breath sounds: Normal breath sounds.  Skin:    General: Skin is warm and dry.  Neurological:     Mental Status: She is alert and oriented to person, place, and time. Mental status is at baseline.  Psychiatric:        Mood and Affect: Mood normal.        Behavior: Behavior normal.  UC Treatments / Results  Labs (all labs ordered are listed, but  only abnormal results are displayed) Labs Reviewed  SARS CORONAVIRUS 2 BY RT PCR    EKG   Radiology No results found.  Procedures Procedures (including critical care time)  Medications Ordered in UC Medications - No data to display  Initial Impression / Assessment and Plan / UC Course  I have reviewed the triage vital signs and the nursing notes.  Pertinent labs & imaging results that were available during my care of the patient were reviewed by me and considered in my medical decision making (see chart for details).  Viral illness  Patient is in no signs of distress nor toxic appearing.  Vital signs are stable.  Low suspicion for pneumonia, pneumothorax or bronchitis and therefore will defer imaging.  COVID test negative.Prescribed Zofran and ketorolac.  Toradol injection given in office for management of constant headache.May use additional over-the-counter medications as needed for supportive care.  May follow-up with urgent care as needed if symptoms persist or worsen.  Note given.   Final Clinical Impressions(s) / UC Diagnoses   Final diagnoses:  None   Discharge Instructions   None    ED Prescriptions   None    PDMP not reviewed this encounter.   Hans Eden, NP 10/02/22 1119

## 2022-10-02 NOTE — ED Triage Notes (Signed)
Patient c/o bodyaches, chills, headache that started 4 days ago.  Patient unsure of fevers.

## 2022-10-02 NOTE — Discharge Instructions (Addendum)
Your symptoms today are most likely being caused by a virus and should steadily improve in time it can take up to 7 to 10 days before you truly start to see a turnaround however things will get better  COVID testing is negative  You are given an injection of Toradol today here in office to help manage her headaches, you may take ketorolac tablet every 6 hours as needed for additional manage [employer] may use Zofran every 8 hours as needed to help manage your nausea   You can take Tylenol and/or Ibuprofen as needed for fever reduction and pain relief.   For cough: honey 1/2 to 1 teaspoon (you can dilute the honey in water or another fluid).  You can also use guaifenesin and dextromethorphan for cough. You can use a humidifier for chest congestion and cough.  If you don't have a humidifier, you can sit in the bathroom with the hot shower running.      For sore throat: try warm salt water gargles, cepacol lozenges, throat spray, warm tea or water with lemon/honey, popsicles or ice, or OTC cold relief medicine for throat discomfort.   For congestion: take a daily anti-histamine like Zyrtec, Claritin, and a oral decongestant, such as pseudoephedrine.  You can also use Flonase 1-2 sprays in each nostril daily.   It is important to stay hydrated: drink plenty of fluids (water, gatorade/powerade/pedialyte, juices, or teas) to keep your throat moisturized and help further relieve irritation/discomfort.

## 2022-10-18 ENCOUNTER — Ambulatory Visit
Admission: EM | Admit: 2022-10-18 | Discharge: 2022-10-18 | Disposition: A | Discharge status: Home / Self Care | Payer: Medicaid Other | Attending: Family Medicine | Admitting: Family Medicine

## 2022-10-18 ENCOUNTER — Encounter: Payer: Self-pay | Admitting: Emergency Medicine

## 2022-10-18 DIAGNOSIS — J441 Chronic obstructive pulmonary disease with (acute) exacerbation: Secondary | ICD-10-CM | POA: Diagnosis not present

## 2022-10-18 MED ORDER — PREDNISONE 50 MG PO TABS: ORAL_TABLET | ORAL | 0 refills | Status: DC

## 2022-10-18 MED ORDER — DOXYCYCLINE HYCLATE 100 MG PO TABS: 100.0000 mg | ORAL_TABLET | Freq: Two times a day (BID) | ORAL | 0 refills | Status: DC

## 2022-10-18 MED ORDER — ALBUTEROL SULFATE HFA 108 (90 BASE) MCG/ACT IN AERS: 1.0000 | INHALATION_SPRAY | Freq: Four times a day (QID) | RESPIRATORY_TRACT | 0 refills | Status: DC | PRN

## 2022-10-18 NOTE — ED Provider Notes (Signed)
[facility]    CSN: [id number] Arrival date & time: 10/18/22  0807      History   Chief Complaint Chief Complaint  Patient presents with   Cough    HPI  47 year old female with a history of obstructive bronchitis and tobacco abuse presents for evaluation of the above.  Patient states that she has been sick for the past 3 days.  She reports cough, chest congestion, shortness of breath, fatigue.  Subjective fever and chills.  She is not currently using an inhaler.  Has not seen a pulmonologist in quite some time.  She also reports ongoing ear discomfort of the right ear.  She states that she has had multiple tubes in the past.  No fever at this time.  No other complaints or concerns.  Past Medical History:  Diagnosis Date   ETD (eustachian tube dysfunction)    GERD (gastroesophageal reflux disease)     Patient Active Problem List   Diagnosis Date Noted   Cyst of skin 01/26/2018   Encounter for screening mammogram for breast cancer 01/26/2018   Obstructive chronic bronchitis without exacerbation 04/09/2014   Tobacco abuse 04/09/2014   Marijuana abuse 04/09/2014    Past Surgical History:  Procedure Laterality Date   CHOLECYSTECTOMY     TUBAL LIGATION      OB History     Gravida  5   Para  3   Term      Preterm      AB  1   Living  3      SAB  1   IAB      Ectopic      Multiple  3   Live Births  3        Obstetric Comments  1st Menstrual Cycle:  12  1st Pregnancy:  14           Home Medications    Prior to Admission medications   Medication Sig Start Date End Date Taking? Authorizing Provider  albuterol (VENTOLIN HFA) 108 (90 Base) MCG/ACT inhaler Inhale 1-2 puffs into the lungs every 6 (six) hours as needed for wheezing or shortness of breath. 10/18/22  Yes Briane Birden G, DO  doxycycline (VIBRA-TABS) 100 MG tablet Take 1 tablet (100 mg total) by mouth 2 (two) times daily. 10/18/22  Yes Kayra Crowell G, DO  predniSONE  (DELTASONE) 50 MG tablet 1 tablet daily x 5 days 10/18/22  Yes Devine Dant G, DO  acetaminophen (TYLENOL) 325 MG tablet Take 650 mg by mouth every 6 (six) hours as needed.    [provider]    Family History Family History  Problem Relation Age of Onset   Cancer Maternal Grandfather        prostate   Esophagitis Father    Breast cancer Mother 68       double mastectomy in 17   Asthma Mother     Social History Social History   Tobacco Use   Smoking status: Every Day    Packs/day: 0.50    Years: 26.00    Total pack years: 13.00    Types: Cigarettes   Smokeless tobacco: Never  Vaping Use   Vaping Use: Some days   Substances: Flavoring  Substance Use Topics   Alcohol use: No   Drug use: Yes    Frequency: 7.0 times per week    Types: Marijuana    Comment: last night used     Allergies   Beeswax,  Carrot [daucus carota], Cats claw [uncaria tomentosa (cats claw)], and Erythromycin   Review of Systems Review of Systems Per HPI  Physical Exam Triage Vital Signs ED Triage Vitals  Enc Vitals Group     BP 10/18/22 0830 115/83     Pulse Rate 10/18/22 0830 85     Resp 10/18/22 0830 16     Temp 10/18/22 0830 97.8 F (36.6 C)     Temp Source 10/18/22 0830 Oral     SpO2 10/18/22 0830 97 %     Weight 10/18/22 0828 171 lb 1.2 oz (77.6 kg)     Height 10/18/22 0828 5' 1.5" (1.562 m)     Head Circumference --      Peak Flow --      Pain Score 10/18/22 0827 6     Pain Loc --      Pain Edu? --      Excl. in [place]? --    Updated Vital Signs BP 115/83 (BP Location: Right Arm)   Pulse 85   Temp 97.8 F (36.6 C) (Oral)   Resp 16   Ht 5' 1.5" (1.562 m)   Wt 77.6 kg   SpO2 97%   BMI 31.80 kg/m   Visual Acuity Right Eye Distance:   Left Eye Distance:   Bilateral Distance:    Right Eye Near:   Left Eye Near:    Bilateral Near:     Physical Exam Vitals and nursing note reviewed.  Constitutional:      General: She is not in acute distress.    Appearance:  Normal appearance.  HENT:     Head: Normocephalic and atraumatic.     Left Ear: Tympanic membrane normal.     Ears:     Comments: Right TM with effusion.  Eyes:     General:        Right eye: No discharge.        Left eye: No discharge.     Conjunctiva/sclera: Conjunctivae normal.  Cardiovascular:     Rate and Rhythm: Normal rate and regular rhythm.  Pulmonary:     Effort: Pulmonary effort is normal.     Breath sounds: Normal breath sounds. No wheezing or rales.  Neurological:     Mental Status: She is alert.  Psychiatric:        Mood and Affect: Mood normal.        Behavior: Behavior normal.    UC Treatments / Results  Labs (all labs ordered are listed, but only abnormal results are displayed) Labs Reviewed - No data to display  EKG   Radiology No results found.  Procedures Procedures (including critical care time)  Medications Ordered in UC Medications - No data to display  Initial Impression / Assessment and Plan / UC Course  I have reviewed the triage vital signs and the nursing notes.  Pertinent labs & imaging results that were available during my care of the patient were reviewed by me and considered in my medical decision making (see chart for details).    47 year old female presents for evaluation the above.  Patient has a history of chronic obstructive bronchitis.  As result, treating for exacerbation with doxycycline, prednisone.  Also albuterol.  Final Clinical Impressions(s) / UC Diagnoses   Final diagnoses:  COPD exacerbation [facility])     Discharge Instructions      Medications as prescribed.  Please try and quit smoking.  Take care  Dr. Lacinda Axon     ED Prescriptions  Medication Sig Dispense Auth. Provider   doxycycline (VIBRA-TABS) 100 MG tablet Take 1 tablet (100 mg total) by mouth 2 (two) times daily. 14 tablet Lalah Durango G, DO   predniSONE (DELTASONE) 50 MG tablet 1 tablet daily x 5 days 5 tablet Jaxzen Vanhorn G, DO   albuterol  (VENTOLIN HFA) 108 (90 Base) MCG/ACT inhaler Inhale 1-2 puffs into the lungs every 6 (six) hours as needed for wheezing or shortness of breath. 18 g Tommie Sams, DO      PDMP not reviewed this encounter.   Everlene Other [place], [place] 10/18/22 [phone]

## 2022-10-18 NOTE — Discharge Instructions (Signed)
Medications as prescribed.  Please try and quit smoking.  Take care  Dr. Lacinda Axon

## 2022-10-18 NOTE — ED Triage Notes (Signed)
Pt c/o cough, chest congestion, shortness of breath, fatigue, subjective fever, chills, and body aches. Started 3 days ago.

## 2022-10-19 ENCOUNTER — Telehealth: Payer: Self-pay | Admitting: Emergency Medicine

## 2022-10-19 MED ORDER — PROMETHAZINE-DM 6.25-15 MG/5ML PO SYRP: 5.0000 mL | ORAL_SOLUTION | Freq: Four times a day (QID) | ORAL | 0 refills | Status: DC | PRN

## 2022-10-19 MED ORDER — LEVOFLOXACIN 500 MG PO TABS: 500.0000 mg | ORAL_TABLET | Freq: Every day | ORAL | 0 refills | Status: DC

## 2022-10-19 NOTE — Telephone Encounter (Signed)
Patient presents with a complaint of nausea and vomiting from doxycycline and is requesting a different medication.  She also states that she needs something for her cough.  I will switch the doxycycline to Levaquin 500 mg once daily for 7 days for treatment of her chronic bronchitis exacerbation and add on Promethazine DM cough syrup.  Prescription sent to pharmacy.

## 2022-10-24 ENCOUNTER — Ambulatory Visit (INDEPENDENT_AMBULATORY_CARE_PROVIDER_SITE_OTHER): Payer: Medicaid Other

## 2022-10-24 ENCOUNTER — Ambulatory Visit
Admission: EM | Admit: 2022-10-24 | Discharge: 2022-10-24 | Disposition: A | Discharge status: Home / Self Care | Payer: Medicaid Other | Attending: Family Medicine | Admitting: Family Medicine

## 2022-10-24 DIAGNOSIS — J441 Chronic obstructive pulmonary disease with (acute) exacerbation: Secondary | ICD-10-CM

## 2022-10-24 DIAGNOSIS — R059 Cough, unspecified: Secondary | ICD-10-CM

## 2022-10-24 MED ORDER — CEFDINIR 300 MG PO CAPS: 300.0000 mg | ORAL_CAPSULE | Freq: Two times a day (BID) | ORAL | 0 refills | Status: AC

## 2022-10-24 MED ORDER — IPRATROPIUM BROMIDE 0.02 % IN SOLN: 0.5000 mg | Freq: Once | RESPIRATORY_TRACT | Status: DC

## 2022-10-24 MED ORDER — ATROVENT HFA 17 MCG/ACT IN AERS: 2.0000 | INHALATION_SPRAY | RESPIRATORY_TRACT | 12 refills | Status: DC | PRN

## 2022-10-24 MED ORDER — PREDNISONE 10 MG (21) PO TBPK: ORAL_TABLET | Freq: Every day | ORAL | 0 refills | Status: DC

## 2022-10-24 NOTE — ED Triage Notes (Signed)
Pt c/o cough & rib pain x11 days, was seen on 1/21 & was given inhalers,abx,steriods & cough syrup w/o relief. Denies any fevers,chills or SOB.

## 2022-10-24 NOTE — ED Provider Notes (Signed)
[facility]    CSN: [id number] Arrival date & time: 10/24/22  0808      History   Chief Complaint Chief Complaint  Patient presents with   Cough   Rib Pain    HPI Marilyn Hendricks is a 47 y.o. female.   HPI   Marilyn Hendricks presents for cough for the past 1-2 weeks. She was seen in the UC and given that inhaler, antibiotics and steroids.  She has been using cough syrup. She has completed antibiotics and steroids. Has bilateral rib pain from coughing. She has some cough incontinence with coughing.  Cough is now dry but was productive in the beginning.Marland Kitchen Has some mild nasal congestion, sinus pressure and headache which has improved. She gets warm but doesn't have a fever and then has chills.  She stopped using the inhaler because it made her shake for 5 hours.  Denies all other symptoms.        Past Medical History:  Diagnosis Date   ETD (eustachian tube dysfunction)    GERD (gastroesophageal reflux disease)     Patient Active Problem List   Diagnosis Date Noted   Cyst of skin 01/26/2018   Encounter for screening mammogram for breast cancer 01/26/2018   Obstructive chronic bronchitis without exacerbation 04/09/2014   Tobacco abuse 04/09/2014   Marijuana abuse 04/09/2014    Past Surgical History:  Procedure Laterality Date   CHOLECYSTECTOMY     TUBAL LIGATION      OB History     Gravida  5   Para  3   Term      Preterm      AB  1   Living  3      SAB  1   IAB      Ectopic      Multiple  3   Live Births  3        Obstetric Comments  1st Menstrual Cycle:  12  1st Pregnancy:  14           Home Medications    Prior to Admission medications   Medication Sig Start Date End Date Taking? Authorizing Provider  acetaminophen (TYLENOL) 325 MG tablet Take 650 mg by mouth every 6 (six) hours as needed.   Yes [provider]  albuterol (VENTOLIN HFA) 108 (90 Base) MCG/ACT inhaler Inhale 1-2 puffs into the lungs every 6 (six)  hours as needed for wheezing or shortness of breath. 10/18/22  Yes Cook, Jayce G, DO  cefdinir (OMNICEF) 300 MG capsule Take 1 capsule (300 mg total) by mouth 2 (two) times daily for 5 days. 10/24/22 10/29/22 Yes Ariyah Sedlack, DO  ipratropium (ATROVENT HFA) 17 MCG/ACT inhaler Inhale 2 puffs into the lungs every 4 (four) hours as needed for wheezing. 10/24/22  Yes Avneet Ashmore, DO  predniSONE (STERAPRED UNI-PAK 21 TAB) 10 MG (21) TBPK tablet Take by mouth daily. Take 6 tabs by mouth daily  for 2 days, then 5 tabs for 2 days, then 4 tabs for 2 days, then 3 tabs for 2 days, 2 tabs for 2 days, then 1 tab by mouth daily for 2 days 10/24/22  Yes Kytzia Gienger, DO  promethazine-dextromethorphan (PROMETHAZINE-DM) 6.25-15 MG/5ML syrup Take 5 mLs by mouth 4 (four) times daily as needed. 10/19/22  Yes Margarette Canada, NP    Family History Family History  Problem Relation Age of Onset   Cancer Maternal Grandfather        prostate   Esophagitis Father  Breast cancer Mother 52       double mastectomy in 47   Asthma Mother     Social History Social History   Tobacco Use   Smoking status: Every Day    Packs/day: 0.50    Years: 26.00    Total pack years: 13.00    Types: Cigarettes   Smokeless tobacco: Never  Vaping Use   Vaping Use: Some days   Substances: Flavoring  Substance Use Topics   Alcohol use: No   Drug use: Yes    Frequency: 7.0 times per week    Types: Marijuana    Comment: last night used     Allergies   Doxycycline, Beeswax, Carrot [daucus carota], Cats claw [uncaria tomentosa (cats claw)], and Erythromycin   Review of Systems Review of Systems: negative unless otherwise stated in HPI.      Physical Exam Triage Vital Signs ED Triage Vitals  Enc Vitals Group     BP 10/24/22 0824 116/80     Pulse Rate 10/24/22 0824 74     Resp 10/24/22 0824 16     Temp 10/24/22 0824 98.1 F (36.7 C)     Temp Source 10/24/22 0824 Oral     SpO2 10/24/22 0824 94 %     Weight  10/24/22 0822 170 lb (77.1 kg)     Height 10/24/22 0822 5' 1.5" (1.562 m)     Head Circumference --      Peak Flow --      Pain Score 10/24/22 0821 5     Pain Loc --      Pain Edu? --      Excl. in [place]? --    No data found.  Updated Vital Signs BP 116/80 (BP Location: Left Arm)   Pulse 74   Temp 98.1 F (36.7 C) (Oral)   Resp 16   Ht 5' 1.5" (1.562 m)   Wt 77.1 kg   SpO2 94%   BMI 31.60 kg/m   Visual Acuity Right Eye Distance:   Left Eye Distance:   Bilateral Distance:    Right Eye Near:   Left Eye Near:    Bilateral Near:     Physical Exam GEN:     alert, non-toxic appearing female in no distress    HENT:  mucus membranes moist, clear nasal discharge EYES:   pupils equal and reactive, no scleral injection or discharge NECK:  normal ROM RESP:  no increased work of breathing, expiratory wheezing  CVS:   regular rate and rhythm Skin:   warm and dry    UC Treatments / Results  Labs (all labs ordered are listed, but only abnormal results are displayed) Labs Reviewed - No data to display  EKG   Radiology DG Chest 2 View  Result Date: 10/24/2022 CLINICAL DATA:  Cough for 2 weeks. EXAM: CHEST - 2 VIEW COMPARISON:  None Available. FINDINGS: The heart size and mediastinal contours are within normal limits. Both lungs are clear. The visualized skeletal structures are unremarkable. IMPRESSION: No active cardiopulmonary disease. Electronically Signed   By: Marlaine Hind M.D.   On: 10/24/2022 09:09    Procedures Procedures (including critical care time)  Medications Ordered in UC Medications - No data to display  Initial Impression / Assessment and Plan / UC Course  I have reviewed the triage vital signs and the nursing notes.  Pertinent labs & imaging results that were available during my care of the patient were reviewed by me and considered in  my medical decision making (see chart for details).       Pt is a 47 y.o. female with history of COPD who presents  for 1-2 weeks of cough that is not improving.  [name] is  afebrile here without recent antipyretics. Satting 94% on room air. Overall pt is  non-toxic appearing, well hydrated, without respiratory distress. Pulmonary exam is remarkable for expiratory wheezing.  After shared decision making, we will pursue chest x-ray. COVID was negative on 10/02/22.   Chest xray personally reviewed by me without focal pneumonia, pleural effusion, cardiomegaly or pneumothorax. Treat acute bronchitis with steroids and antibiotics as below.  Discontinue albuterol per pt preference.  As she has COPD, switch to Atrovent.   Typical duration of symptoms discussed. Return and ED precautions given and patient voiced understanding.   Discussed MDM, treatment plan and plan for follow-up with patient who agrees with plan.      Final Clinical Impressions(s) / UC Diagnoses   Final diagnoses:  COPD exacerbation [facility])     Discharge Instructions      Your chest x-ray did not show any pneumonia.  Stop by the pharmacy to pick up your steroids and antibiotics.      ED Prescriptions     Medication Sig Dispense Auth. Provider   cefdinir (OMNICEF) 300 MG capsule Take 1 capsule (300 mg total) by mouth 2 (two) times daily for 5 days. 10 capsule Cyndia Degraff, DO   predniSONE (STERAPRED UNI-PAK 21 TAB) 10 MG (21) TBPK tablet Take by mouth daily. Take 6 tabs by mouth daily  for 2 days, then 5 tabs for 2 days, then 4 tabs for 2 days, then 3 tabs for 2 days, 2 tabs for 2 days, then 1 tab by mouth daily for 2 days 42 tablet Lane Eland, DO   ipratropium (ATROVENT HFA) 17 MCG/ACT inhaler Inhale 2 puffs into the lungs every 4 (four) hours as needed for wheezing. 1 each Katha Cabal, DO      PDMP not reviewed this encounter.   Katha Cabal, DO 10/24/22 [phone]

## 2022-10-24 NOTE — Discharge Instructions (Addendum)
Your chest x-ray did not show any pneumonia.  Stop by the pharmacy to pick up your steroids and antibiotics.

## 2022-10-29 ENCOUNTER — Ambulatory Visit
Admission: EM | Admit: 2022-10-29 | Discharge: 2022-10-29 | Disposition: A | Discharge status: Home / Self Care | Payer: Medicaid Other | Attending: Family Medicine | Admitting: Family Medicine

## 2022-10-29 ENCOUNTER — Telehealth: Payer: Self-pay | Admitting: Family Medicine

## 2022-10-29 ENCOUNTER — Encounter: Payer: Self-pay | Admitting: Emergency Medicine

## 2022-10-29 DIAGNOSIS — D72828 Other elevated white blood cell count: Secondary | ICD-10-CM | POA: Insufficient documentation

## 2022-10-29 DIAGNOSIS — G252 Other specified forms of tremor: Secondary | ICD-10-CM

## 2022-10-29 LAB — CBC WITH DIFFERENTIAL/PLATELET
Abs Immature Granulocytes: 0.4 10*3/uL — ABNORMAL HIGH (ref 0.00–0.07)
Band Neutrophils: 5 %
Basophils Absolute: 0 10*3/uL (ref 0.0–0.1)
Basophils Relative: 0 %
Eosinophils Absolute: 0 10*3/uL (ref 0.0–0.5)
Eosinophils Relative: 0 %
HCT: 44.2 % (ref 36.0–46.0)
Hemoglobin: 15.1 g/dL — ABNORMAL HIGH (ref 12.0–15.0)
Lymphocytes Relative: 50 %
Lymphs Abs: 9.1 10*3/uL — ABNORMAL HIGH (ref 0.7–4.0)
MCH: 32.4 pg (ref 26.0–34.0)
MCHC: 34.2 g/dL (ref 30.0–36.0)
MCV: 94.8 fL (ref 80.0–100.0)
Monocytes Absolute: 0.2 10*3/uL (ref 0.1–1.0)
Monocytes Relative: 1 %
Myelocytes: 1 %
Neutro Abs: 8.6 10*3/uL — ABNORMAL HIGH (ref 1.7–7.7)
Neutrophils Relative %: 42 %
Platelets: 251 10*3/uL (ref 150–400)
Promyelocytes Relative: 1 %
RBC: 4.66 MIL/uL (ref 3.87–5.11)
RDW: 13.7 % (ref 11.5–15.5)
WBC: 18.2 10*3/uL — ABNORMAL HIGH (ref 4.0–10.5)
nRBC: 0 % (ref 0.0–0.2)

## 2022-10-29 LAB — COMPREHENSIVE METABOLIC PANEL
ALT: 38 U/L (ref 0–44)
AST: 19 U/L (ref 15–41)
Albumin: 3.8 g/dL (ref 3.5–5.0)
Alkaline Phosphatase: 70 U/L (ref 38–126)
Anion gap: 10 (ref 5–15)
BUN: 18 mg/dL (ref 6–20)
CO2: 25 mmol/L (ref 22–32)
Calcium: 8.5 mg/dL — ABNORMAL LOW (ref 8.9–10.3)
Chloride: 98 mmol/L (ref 98–111)
Creatinine, Ser: 0.81 mg/dL (ref 0.44–1.00)
GFR, Estimated: >60 mL/min (ref >60)
Glucose, Bld: 88 mg/dL (ref 70–99)
Potassium: 3.5 mmol/L (ref 3.5–5.1)
Sodium: 133 mmol/L — ABNORMAL LOW (ref 135–145)
Total Bilirubin: 0.7 mg/dL (ref 0.3–1.2)
Total Protein: 7.2 g/dL (ref 6.5–8.1)

## 2022-10-29 LAB — TSH: TSH: 1.068 u[IU]/mL (ref 0.350–4.500)

## 2022-10-29 NOTE — ED Triage Notes (Addendum)
Pt was seen 3 times in the past 3 weeks for cough and SOB. The medication that has been to the patient is not helping. For the past 4 days patient has been shaking and fropping things at work. She states she feels jumping out of her skin. Pt states she's having hot flashes and her skin is turning pale.

## 2022-10-29 NOTE — ED Provider Notes (Signed)
[facility]    CSN: [id number] Arrival date & time: 10/29/22  8657      History   Chief Complaint Chief Complaint  Patient presents with   Jittery     HPI Marilyn Hendricks is a 47 y.o. female.   HPI   Marilyn Hendricks presents for feeling jittery. Has been taking medication.   For the past few days she has been feeling like she felt like she was going to shake out of her skin.  She was shaking profusely at work.  No history of anxiety or panic. She drinks coffee but has hasn't drank anything recently.   Stopped having periods about 10 months ago. Belives she is perimenopausal.   Denies fever but had some chills the past couple of days. Has rhinorrhea and ongoing cough. No vomiting, diarrhea or shortness of breath. Has not had any unintended weight loss.      Past Medical History:  Diagnosis Date   ETD (eustachian tube dysfunction)    GERD (gastroesophageal reflux disease)     Patient Active Problem List   Diagnosis Date Noted   Lymphocytosis 11/02/2022   Cyst of skin 01/26/2018   Encounter for screening mammogram for breast cancer 01/26/2018   Obstructive chronic bronchitis without exacerbation 04/09/2014   Tobacco abuse 04/09/2014   Marijuana abuse 04/09/2014    Past Surgical History:  Procedure Laterality Date   CHOLECYSTECTOMY     TUBAL LIGATION      OB History     Gravida  5   Para  3   Term      Preterm      AB  1   Living  3      SAB  1   IAB      Ectopic      Multiple  3   Live Births  3        Obstetric Comments  1st Menstrual Cycle:  12  1st Pregnancy:  14           Home Medications    Prior to Admission medications   Medication Sig Start Date End Date Taking? Authorizing Provider  acetaminophen (TYLENOL) 325 MG tablet Take 650 mg by mouth every 6 (six) hours as needed.    [provider]  albuterol (VENTOLIN HFA) 108 (90 Base) MCG/ACT inhaler Inhale 1-2 puffs into the lungs every 6 (six) hours as  needed for wheezing or shortness of breath. 10/18/22   Thersa Salt G, DO  ipratropium (ATROVENT HFA) 17 MCG/ACT inhaler Inhale 2 puffs into the lungs every 4 (four) hours as needed for wheezing. 10/24/22   Issiac Jamar, Ronnette Juniper, DO  predniSONE (STERAPRED UNI-PAK 21 TAB) 10 MG (21) TBPK tablet Take by mouth daily. Take 6 tabs by mouth daily  for 2 days, then 5 tabs for 2 days, then 4 tabs for 2 days, then 3 tabs for 2 days, 2 tabs for 2 days, then 1 tab by mouth daily for 2 days 10/24/22   Lyndee Hensen, DO  promethazine-dextromethorphan (PROMETHAZINE-DM) 6.25-15 MG/5ML syrup Take 5 mLs by mouth 4 (four) times daily as needed. 10/19/22   Margarette Canada, NP    Family History Family History  Problem Relation Age of Onset   Breast cancer Mother 56       double mastectomy in 1994   Asthma Mother    Esophagitis Father    Cancer Maternal Grandfather        prostate    Social History Social History  Tobacco Use   Smoking status: Every Day    Packs/day: 0.50    Years: 26.00    Total pack years: 13.00    Types: Cigarettes   Smokeless tobacco: Never  Vaping Use   Vaping Use: Some days   Substances: Flavoring  Substance Use Topics   Alcohol use: No   Drug use: Yes    Frequency: 7.0 times per week    Types: Marijuana    Comment: last night used     Allergies   Doxycycline, Beeswax, Carrot [daucus carota], Cats claw [uncaria tomentosa (cats claw)], and Erythromycin   Review of Systems Review of Systems: negative unless otherwise stated in HPI.      Physical Exam Triage Vital Signs ED Triage Vitals  Enc Vitals Group     BP 10/29/22 0848 125/88     Pulse Rate 10/29/22 0848 75     Resp 10/29/22 0848 16     Temp 10/29/22 0848 97.9 F (36.6 C)     Temp Source 10/29/22 0848 Oral     SpO2 10/29/22 0848 99 %     Weight --      Height --      Head Circumference --      Peak Flow --      Pain Score 10/29/22 0847 0     Pain Loc --      Pain Edu? --      Excl. in [place]? --    No data  found.  Updated Vital Signs BP 125/88 (BP Location: Left Arm)   Pulse 75   Temp 97.9 F (36.6 C) (Oral)   Resp 16   SpO2 99%   Visual Acuity Right Eye Distance:   Left Eye Distance:   Bilateral Distance:    Right Eye Near:   Left Eye Near:    Bilateral Near:     Physical Exam GEN:     alert, non-toxic appearing and no distress    HENT:  mucus membranes moist, oropharyngeal without lesions or erythema,  nares patent, no nasal discharge  EYES:   pupils equal and reactive, EOM intact NECK:  supple, normal ROM, no lymphadenopathy  RESP:  Coarse breathe sounds, no increased work of breathing  CVS:   regular rate and rhythm, no murmur, distal pulses intact   EXT:   normal ROM, atraumatic, no edema  NEURO:  normal without focal findings,  speech normal, alert and oriented   Skin:   warm and dry, no rash, normal skin turgor Psych: Normal affect, appropriate speech and behavior      UC Treatments / Results  Labs (all labs ordered are listed, but only abnormal results are displayed) Labs Reviewed  COMPREHENSIVE METABOLIC PANEL - Abnormal; Notable for the following components:      Result Value   Sodium 133 (*)    Calcium 8.5 (*)    All other components within normal limits  CBC WITH DIFFERENTIAL/PLATELET - Abnormal; Notable for the following components:   WBC 18.2 (*)    Hemoglobin 15.1 (*)    Neutro Abs 8.6 (*)    Lymphs Abs 9.1 (*)    Abs Immature Granulocytes 0.40 (*)    All other components within normal limits  TSH  PATHOLOGIST SMEAR REVIEW    EKG   Radiology CLINICAL DATA:  Cough for 2 weeks.   EXAM: CHEST - 2 VIEW   COMPARISON:  None Available.   FINDINGS: The heart size and mediastinal contours are within normal limits.  Both lungs are clear. The visualized skeletal structures are unremarkable.   IMPRESSION: No active cardiopulmonary disease.     Electronically Signed   By: Marlaine Hind M.D.   On: 10/24/2022 09:09   Procedures Procedures  (including critical care time)  Medications Ordered in UC Medications - No data to display  Initial Impression / Assessment and Plan / UC Course  I have reviewed the triage vital signs and the nursing notes.  Pertinent labs & imaging results that were available during my care of the patient were reviewed by me and considered in my medical decision making (see chart for details).       Patient is a 47 y.o. female  who presents for feeling jittery for the past few days.  Overall patient is nontoxic-appearing and afebrile.  Vital signs stable.   Has persistent cough despite Cefdinir and prednisone. Chest xray personally reviewed by me without focal pneumonia, pleural effusion, cardiomegaly or pneumothorax. Radiologist agrees.   On chart review, she had a normal CT soft tissue neck in Feb 2021. Mammogram was normal in May 2019.  She had a CT Chest that showed some mild emphysematous changes with a times most region of the right lung apex measuring about 12 mm.  Patient reports she had subsequent imaging that said that this had resolved but I do not see this in the chart.   She has an abnormal CBC with frank leukocytosis as high as 18.  The only other to CBC is available for review were in October 29, 2019 that showed a white count of 18.8 for acute gastritis in the ED and in September 2013 with a white count of 15.7 with an outside ED.There is no differential available for these results however today she has blasts, smudge cells and stomatocytes. I am unsure if this all due to steroid use as she had an elevated WBC with gastritis. Additionally, she has Hgb 15.2   Pt admitted that she doesn't go to the "regular doctor." Has a PCP appointment in about 2-3 months. She has mostly been seen at the podiatry, pulmonology, UC or ED in the greater 10 years. Etiology an prognosis of cough and leukocytosis is uncertain. Referral to hematology placed.   ED and return precautions given and patient/guardian  voiced understanding. Discussed MDM, treatment plan and plan for follow-up with patient who agrees with plan.    Final Clinical Impressions(s) / UC Diagnoses   Final diagnoses:  Other elevated white blood cell (WBC) count  Coarse tremors     Discharge Instructions      I will call you to discuss your microscope blood evaluation when it becomes available. You may need to see a hematologist or go back to your pulmonologist.     ED Prescriptions   None    PDMP not reviewed this encounter.   Lyndee Hensen, DO 11/02/22 1525

## 2022-10-29 NOTE — Telephone Encounter (Signed)
Patient with multiple immature blood cell lines on smear review. She has frank leukocytosis after taking 4 days of steroids. I am unsure that this is related to steriod use. She has smudge cells and stomatocytes.    Urgent referral to hematology-oncology placed.   Patient with sister (via phone) called and updated with referral to hematology.  All questions asked were answered.   Lyndee Hensen, DO

## 2022-10-30 ENCOUNTER — Ambulatory Visit: Payer: Medicaid Other | Admitting: Internal Medicine

## 2022-10-30 LAB — PATHOLOGIST SMEAR REVIEW

## 2022-11-02 ENCOUNTER — Inpatient Hospital Stay: Payer: Medicaid Other

## 2022-11-02 ENCOUNTER — Inpatient Hospital Stay: Payer: Medicaid Other | Attending: Internal Medicine | Admitting: Internal Medicine

## 2022-11-02 ENCOUNTER — Encounter: Payer: Self-pay | Admitting: Internal Medicine

## 2022-11-02 VITALS — BP 124/87 | HR 90 | Temp 99.0°F | Resp 18 | Wt 174.6 lb

## 2022-11-02 DIAGNOSIS — Z79899 Other long term (current) drug therapy: Secondary | ICD-10-CM | POA: Diagnosis not present

## 2022-11-02 DIAGNOSIS — D72820 Lymphocytosis (symptomatic): Secondary | ICD-10-CM

## 2022-11-02 DIAGNOSIS — Z139 Encounter for screening, unspecified: Secondary | ICD-10-CM | POA: Diagnosis not present

## 2022-11-02 DIAGNOSIS — D72828 Other elevated white blood cell count: Secondary | ICD-10-CM

## 2022-11-02 DIAGNOSIS — K760 Fatty (change of) liver, not elsewhere classified: Secondary | ICD-10-CM | POA: Diagnosis not present

## 2022-11-02 DIAGNOSIS — F17210 Nicotine dependence, cigarettes, uncomplicated: Secondary | ICD-10-CM | POA: Diagnosis not present

## 2022-11-02 LAB — COMPREHENSIVE METABOLIC PANEL
ALT: 51 U/L — ABNORMAL HIGH (ref 0–44)
AST: 28 U/L (ref 15–41)
Albumin: 4 g/dL (ref 3.5–5.0)
Alkaline Phosphatase: 75 U/L (ref 38–126)
Anion gap: 9 (ref 5–15)
BUN: 13 mg/dL (ref 6–20)
CO2: 26 mmol/L (ref 22–32)
Calcium: 9 mg/dL (ref 8.9–10.3)
Chloride: 100 mmol/L (ref 98–111)
Creatinine, Ser: 0.87 mg/dL (ref 0.44–1.00)
GFR, Estimated: >60 mL/min (ref >60)
Glucose, Bld: 90 mg/dL (ref 70–99)
Potassium: 3.9 mmol/L (ref 3.5–5.1)
Sodium: 135 mmol/L (ref 135–145)
Total Bilirubin: 0.4 mg/dL (ref 0.3–1.2)
Total Protein: 7 g/dL (ref 6.5–8.1)

## 2022-11-02 LAB — CBC WITH DIFFERENTIAL/PLATELET
Abs Immature Granulocytes: 0.04 10*3/uL (ref 0.00–0.07)
Basophils Absolute: 0 10*3/uL (ref 0.0–0.1)
Basophils Relative: 0 %
Eosinophils Absolute: 0.2 10*3/uL (ref 0.0–0.5)
Eosinophils Relative: 1 %
HCT: 41.6 % (ref 36.0–46.0)
Hemoglobin: 14.2 g/dL (ref 12.0–15.0)
Immature Granulocytes: 0 %
Lymphocytes Relative: 38 %
Lymphs Abs: 4.8 10*3/uL — ABNORMAL HIGH (ref 0.7–4.0)
MCH: 32.1 pg (ref 26.0–34.0)
MCHC: 34.1 g/dL (ref 30.0–36.0)
MCV: 94.1 fL (ref 80.0–100.0)
Monocytes Absolute: 0.7 10*3/uL (ref 0.1–1.0)
Monocytes Relative: 5 %
Neutro Abs: 7 10*3/uL (ref 1.7–7.7)
Neutrophils Relative %: 56 %
Platelets: 219 10*3/uL (ref 150–400)
RBC: 4.42 MIL/uL (ref 3.87–5.11)
RDW: 13.5 % (ref 11.5–15.5)
WBC: 12.7 10*3/uL — ABNORMAL HIGH (ref 4.0–10.5)
nRBC: 0 % (ref 0.0–0.2)

## 2022-11-02 LAB — LACTATE DEHYDROGENASE: LDH: 188 U/L (ref 98–192)

## 2022-11-02 LAB — C-REACTIVE PROTEIN: CRP: 1.8 mg/dL — ABNORMAL HIGH (ref <1.0)

## 2022-11-02 NOTE — Discharge Instructions (Signed)
I will call you to discuss your microscope blood evaluation when it becomes available. You may need to see a hematologist or go back to your pulmonologist.

## 2022-11-02 NOTE — Progress Notes (Signed)
Marilyn Hendricks OFFICE PROGRESS NOTE  Patient Care Team: Patient, No Pcp Per as PCP - General (General Practice) Marilyn Junker, RN as Registered Nurse Marilyn Demark, RN (Inactive) as Registered Nurse   # HEMATOLOGY HISTORY:  # LEUCOCYTOSIS- WBC-; N; L; Hb- platelets   Oncology History   No history exists.      INTERVAL HISTORY: Accompanied by her mother.  Sister; hospice nurse on the phone.  Marilyn Hendricks 47 y.o.  female pleasant patient history of active smoking-has been referred to [place] for further evaluation recommendations for mild to moderate leukocytosis.  Patient was recently evaluated in the urgent care clinic for recent episode of bronchitis.  Patient was treated with steroids/antibiotics x 2.  Patient admits to abnormal weight gain abdominal distention.  Complains of abdominal bloating.  Patient has medical complaints including itching head-to-toe without any rash.  Denies any night sweats or early satiety.  Splenectomy: NONE  Gained weight 25 pounds/ in 9 months.   Review of Systems  Constitutional:  Positive for malaise/fatigue. Negative for chills, diaphoresis, fever and weight loss.  HENT:  Negative for nosebleeds and sore throat.   Eyes:  Negative for double vision.  Respiratory:  Negative for cough, hemoptysis, sputum production, shortness of breath and wheezing.   Cardiovascular:  Negative for chest pain, palpitations, orthopnea and leg swelling.  Gastrointestinal:  Negative for abdominal pain, blood in stool, constipation, diarrhea, heartburn, melena, nausea and vomiting.  Genitourinary:  Negative for dysuria, frequency and urgency.  Musculoskeletal:  Negative for back pain and joint pain.  Skin:  Positive for itching. Negative for rash.  Neurological:  Positive for headaches. Negative for dizziness, tingling, focal weakness and weakness.  Endo/Heme/Allergies:  Does not bruise/bleed easily.  Psychiatric/Behavioral:  Negative for  depression. The patient is not nervous/anxious and does not have insomnia.       PAST MEDICAL HISTORY :  Past Medical History:  Diagnosis Date   ETD (eustachian tube dysfunction)    GERD (gastroesophageal reflux disease)     PAST SURGICAL HISTORY :   Past Surgical History:  Procedure Laterality Date   CHOLECYSTECTOMY     TUBAL LIGATION      FAMILY HISTORY :   Family History  Problem Relation Age of Onset   Breast cancer Mother 62       double mastectomy in 1994   Asthma Mother    Esophagitis Father    Cancer Maternal Grandfather        prostate    SOCIAL HISTORY:   Social History   Tobacco Use   Smoking status: Every Day    Packs/day: 0.50    Years: 26.00    Total pack years: 13.00    Types: Cigarettes   Smokeless tobacco: Never  Vaping Use   Vaping Use: Some days   Substances: Flavoring  Substance Use Topics   Alcohol use: No   Drug use: Yes    Frequency: 7.0 times per week    Types: Marijuana    Comment: last night used    ALLERGIES:  is allergic to doxycycline, beeswax, carrot [daucus carota], cats claw [uncaria tomentosa (cats claw)], and erythromycin.  MEDICATIONS:  Current Outpatient Medications  Medication Sig Dispense Refill   acetaminophen (TYLENOL) 325 MG tablet Take 650 mg by mouth every 6 (six) hours as needed.     albuterol (VENTOLIN HFA) 108 (90 Base) MCG/ACT inhaler Inhale 1-2 puffs into the lungs every 6 (six) hours as needed for wheezing or shortness  of breath. 18 g 0   ipratropium (ATROVENT HFA) 17 MCG/ACT inhaler Inhale 2 puffs into the lungs every 4 (four) hours as needed for wheezing. 1 each 12   predniSONE (STERAPRED UNI-PAK 21 TAB) 10 MG (21) TBPK tablet Take by mouth daily. Take 6 tabs by mouth daily  for 2 days, then 5 tabs for 2 days, then 4 tabs for 2 days, then 3 tabs for 2 days, 2 tabs for 2 days, then 1 tab by mouth daily for 2 days 42 tablet 0   promethazine-dextromethorphan (PROMETHAZINE-DM) 6.25-15 MG/5ML syrup Take 5 mLs by  mouth 4 (four) times daily as needed. 118 mL 0   No current facility-administered medications for this visit.    PHYSICAL EXAMINATION:  BP 124/87 (BP Location: Left Arm, Patient Position: Sitting)   Pulse 90   Temp 99 F (37.2 C) (Tympanic)   Resp 18   Wt 174 lb 9.6 oz (79.2 kg)   SpO2 96%   BMI 32.46 kg/m   Filed Weights   11/02/22 1345  Weight: 174 lb 9.6 oz (79.2 kg)    Physical Exam Vitals and nursing note reviewed.  HENT:     Head: Normocephalic and atraumatic.     Mouth/Throat:     Pharynx: Oropharynx is clear.  Eyes:     Extraocular Movements: Extraocular movements intact.     Pupils: Pupils are equal, round, and reactive to light.  Cardiovascular:     Rate and Rhythm: Normal rate and regular rhythm.  Pulmonary:     Comments: Decreased breath sounds bilaterally.  Abdominal:     Palpations: Abdomen is soft.  Musculoskeletal:        General: Normal range of motion.     Cervical back: Normal range of motion.  Skin:    General: Skin is warm.  Neurological:     General: No focal deficit present.     Mental Status: She is alert and oriented to person, place, and time.  Psychiatric:        Behavior: Behavior normal.        Judgment: Judgment normal.        LABORATORY DATA:  I have reviewed the data as listed    Component Value Date/Time   NA 135 11/02/2022 1506   NA 143 06/24/2012 1804   K 3.9 11/02/2022 1506   K 3.7 06/24/2012 1804   CL 100 11/02/2022 1506   CL 109 (H) 06/24/2012 1804   CO2 26 11/02/2022 1506   CO2 26 06/24/2012 1804   GLUCOSE 90 11/02/2022 1506   GLUCOSE 95 06/24/2012 1804   BUN 13 11/02/2022 1506   BUN 5 (L) 06/24/2012 1804   CREATININE 0.87 11/02/2022 1506   CREATININE 0.67 06/24/2012 1804   CALCIUM 9.0 11/02/2022 1506   CALCIUM 9.2 06/24/2012 1804   PROT 7.0 11/02/2022 1506   PROT 7.6 06/24/2012 1804   ALBUMIN 4.0 11/02/2022 1506   ALBUMIN 3.9 06/24/2012 1804   AST 28 11/02/2022 1506   AST 21 06/24/2012 1804   ALT  51 (H) 11/02/2022 1506   ALT 21 06/24/2012 1804   ALKPHOS 75 11/02/2022 1506   ALKPHOS 84 06/24/2012 1804   BILITOT 0.4 11/02/2022 1506   BILITOT 0.5 06/24/2012 1804   GFRNONAA >60 11/02/2022 1506   GFRNONAA >60 06/24/2012 1804   GFRAA >60 10/29/2019 1422   GFRAA >60 06/24/2012 1804    No results found for: "SPEP", "UPEP"  Lab Results  Component Value Date   WBC 12.7 (  H) 11/02/2022   NEUTROABS 7.0 11/02/2022   HGB 14.2 11/02/2022   HCT 41.6 11/02/2022   MCV 94.1 11/02/2022   PLT 219 11/02/2022      Chemistry      Component Value Date/Time   NA 135 11/02/2022 1506   NA 143 06/24/2012 1804   K 3.9 11/02/2022 1506   K 3.7 06/24/2012 1804   CL 100 11/02/2022 1506   CL 109 (H) 06/24/2012 1804   CO2 26 11/02/2022 1506   CO2 26 06/24/2012 1804   BUN 13 11/02/2022 1506   BUN 5 (L) 06/24/2012 1804   CREATININE 0.87 11/02/2022 1506   CREATININE 0.67 06/24/2012 1804      Component Value Date/Time   CALCIUM 9.0 11/02/2022 1506   CALCIUM 9.2 06/24/2012 1804   ALKPHOS 75 11/02/2022 1506   ALKPHOS 84 06/24/2012 1804   AST 28 11/02/2022 1506   AST 21 06/24/2012 1804   ALT 51 (H) 11/02/2022 1506   ALT 21 06/24/2012 1804   BILITOT 0.4 11/02/2022 1506   BILITOT 0.5 06/24/2012 1804       RADIOGRAPHIC STUDIES: I have personally reviewed the radiological images as listed and agreed with the findings in the report. No results found.   ASSESSMENT & PLAN:  Lymphocytosis #White count-/mild lymphocytosis/neutrophilia/monocytosis-likely reactive however rule out other causes-lymphoproliferative: Hemoglobin/platelets normal.  Patient fairly asymptomatic from underlying blood work.  Long discussion with the patient regarding multiple etiologies of elevated white count including but not limited to infection; inflammation; malignancy/leukemia etc.  Clinically suspicion for malignancy is small.  Recommend checking CBC CMP CRP LDH review of peripheral smear; peripheral blood flow  cytometry; BCR ABL FISH; check MPN panel.  # Abnormal weight gain/along with abdominal bloating/distention. - Recommend CT scan AP  # Smoking: Discussed with the patient regarding the ill effects of smoking- including but not limited to cardiac lung and vascular diseases and malignancies. Counseled against smoking.  Discussed smoking is one potential reason for elevated white count.  Thank you for allowing me to participate in the care of your pleasant patient. Please do not hesitate to contact me with questions or concerns in the interim.  # DISPOSITION:  # labs today-  # follow up in 3 week; MD; no labs; CT prior- AP prior- Dr.B   Orders Placed This Encounter  Procedures   CT ABDOMEN PELVIS W CONTRAST    Standing Status:   Future    Standing Expiration Date:   11/03/2023    Order Specific Question:   If indicated for the ordered procedure, I authorize the administration of contrast media per Radiology protocol    Answer:   Yes    Order Specific Question:   Preferred imaging location?    Answer:   [facility]    Order Specific Question:   Radiology Contrast Protocol - do NOT remove file path    Answer:   [email or web address]    Order Specific Question:   Is patient pregnant?    Answer:   No   C-reactive protein    Standing Status:   Future    Number of Occurrences:   1    Standing Expiration Date:   11/03/2023   Lactate dehydrogenase    Standing Status:   Future    Number of Occurrences:   1    Standing Expiration Date:   11/03/2023   CBC with Differential/Platelet    Please do manual diff- and call if abnormal cells seen    Standing Status:  Future    Number of Occurrences:   1    Standing Expiration Date:   11/03/2023   BCR-ABL1 FISH    Standing Status:   Future    Number of Occurrences:   1    Standing Expiration Date:   11/02/2023   JAK2 genotypr    Standing Status:   Future    Number of Occurrences:   1    Standing Expiration Date:    11/03/2023   Flow cytometry panel-leukemia/lymphoma work-up    Standing Status:   Future    Number of Occurrences:   1    Standing Expiration Date:   11/02/2023   Comprehensive metabolic panel    Standing Status:   Future    Number of Occurrences:   1    Standing Expiration Date:   11/03/2023   MPL mutation analysis    Standing Status:   Future    Number of Occurrences:   1    Standing Expiration Date:   11/03/2023   Calreticulin (CALR) Mutation Analysis    Standing Status:   Future    Number of Occurrences:   1    Standing Expiration Date:   11/03/2023   Ambulatory referral to Social Work    Referral Priority:   Routine    Referral Type:   Consultation    Referral Reason:   Specialty Services Required    Number of Visits Requested:   1   All questions were answered. The patient knows to call the clinic with any problems, questions or concerns.      Cammie Sickle, MD 11/02/2022 9:57 PM

## 2022-11-02 NOTE — Progress Notes (Signed)
Started with a cough 4 weeks ago. Took 2 rounds of antibiotics and steroids. Cough was real bad despite medications. Inhalers were making her very jittery. Urgent care sent her here for elevated white blood cells.

## 2022-11-02 NOTE — Assessment & Plan Note (Addendum)
#  White count-/mild lymphocytosis/neutrophilia/monocytosis-likely reactive however rule out other causes-lymphoproliferative: Hemoglobin/platelets normal.   Long discussion with the patient regarding multiple etiologies of elevated white count including but not limited to infection; inflammation; malignancy/leukemia etc.  Clinically suspicion for malignancy is small.  Recommend checking CBC CMP CRP LDH review of peripheral smear; peripheral blood flow cytometry; BCR ABL FISH; check MPN panel.  # Abnormal weight gain/along with abdominal bloating/distention. - Recommend CT scan AP  # Smoking: Discussed with the patient regarding the ill effects of smoking- including but not limited to cardiac lung and vascular diseases and malignancies. Counseled against smoking.  Discussed smoking is one potential reason for elevated white count.  Thank you for allowing me to participate in the care of your pleasant patient. Please do not hesitate to contact me with questions or concerns in the interim.  # DISPOSITION:  # labs today-  # follow up in 3 week; MD; no labs; CT prior- AP prior- Dr.B

## 2022-11-03 ENCOUNTER — Telehealth: Payer: Self-pay

## 2022-11-03 ENCOUNTER — Encounter: Payer: Self-pay | Admitting: Licensed Clinical Social Worker

## 2022-11-03 NOTE — Progress Notes (Signed)
[place] Work  Clinical Social Work was referred by medical provider for assessment of psychosocial needs.  Clinical Social Worker attempted to contact patient by phone  to offer support and assess for needs.  CSW left voicemail with contact information and request for a return call.       Adelene Amas, LCSW  Clinical Social Worker [place]        Patient is participating in a Managed Medicaid Plan:  Yes

## 2022-11-03 NOTE — Telephone Encounter (Signed)
Returned call from VM and spoke to patient.  She states she was given our number by a Education officer, museum to call for CT scan.  Advised that our program only does the LCS LDCT for patients 49 years of age and older, who have a smoking history.  Patient acknowledged understanding.  Advised if she was concerned about her health, to talk with her PCP about alternatives to LDCT.  She said she sees oncology and will follow up with them

## 2022-11-04 LAB — COMP PANEL: LEUKEMIA/LYMPHOMA

## 2022-11-05 ENCOUNTER — Telehealth: Payer: Self-pay | Admitting: *Deleted

## 2022-11-05 LAB — BCR-ABL1 FISH
Cells Analyzed: 200
Cells Counted: 200

## 2022-11-05 NOTE — Telephone Encounter (Signed)
Patient called and states she has a "weird" feeling in her mouth and her tongue Almost like thrush makes it feel, but she does not have thrush. Thinking she may be allergic to something. She states nothing is helping her and that she is still coughing and is now starting to lose her voice off and on. She does not have a PCP yet. Please advise

## 2022-11-06 NOTE — Telephone Encounter (Signed)
Call returned to patient and I got her voice mail and left message as per doctor

## 2022-11-08 LAB — CALRETICULIN (CALR) MUTATION ANALYSIS

## 2022-11-09 ENCOUNTER — Encounter: Payer: Self-pay | Admitting: Licensed Clinical Social Worker

## 2022-11-09 LAB — MPL MUTATION ANALYSIS

## 2022-11-09 NOTE — Progress Notes (Signed)
[place] Work  Clinical Social Work was referred by medical provider for assessment of psychosocial needs.  Clinical Social Worker contacted patient by phone  to offer support and assess for needs.  CSW spoke with patient.  Patient stated she does not have a diagnosis yet and has a scan schedule for the 20th and was told she would meet with the oncologist for the results on 11/26/2022.  CSW asked patient I could call her the first week of March, patient replied in the affirmative.    SA  Adelene Amas, LCSW  Clinical Social Worker [place]        Patient is participating in a Managed Medicaid Plan:  Yes

## 2022-11-10 ENCOUNTER — Telehealth: Payer: Self-pay | Admitting: *Deleted

## 2022-11-10 ENCOUNTER — Ambulatory Visit
Admission: EM | Admit: 2022-11-10 | Discharge: 2022-11-10 | Disposition: A | Discharge status: Home / Self Care | Payer: Medicaid Other | Attending: Emergency Medicine | Admitting: Emergency Medicine

## 2022-11-10 ENCOUNTER — Encounter: Payer: Self-pay | Admitting: Emergency Medicine

## 2022-11-10 DIAGNOSIS — R21 Rash and other nonspecific skin eruption: Secondary | ICD-10-CM

## 2022-11-10 LAB — JAK2 GENOTYPR

## 2022-11-10 MED ORDER — DEXAMETHASONE SODIUM PHOSPHATE 10 MG/ML IJ SOLN
10.0000 mg | Freq: Once | INTRAMUSCULAR | Status: AC
  Administered 2022-11-10: 10 mg via INTRAMUSCULAR

## 2022-11-10 MED ORDER — METHYLPREDNISOLONE 4 MG PO TBPK: ORAL_TABLET | ORAL | 0 refills | Status: DC

## 2022-11-10 MED ORDER — HYDROXYZINE HCL 25 MG PO TABS: 25.0000 mg | ORAL_TABLET | Freq: Four times a day (QID) | ORAL | 0 refills | Status: DC

## 2022-11-10 NOTE — Discharge Instructions (Addendum)
Today you are being treated for the rash it appears inflammatory, no signs of infection  You have been given an injection of steroids today in the office today to help reduce the inflammatory process that occurs with this rash which will help minimize your itching as well as begin to clear  Starting tomorrow take methylprednisolone every morning with food as directed, to continue the above process  May use hydroxyzine every 6 hours for management of itching  You may continue use of topical calamine or Benadryl cream to help manage itching, you may also continue oral Benadryl  Please avoid long exposures to heat such as a hot steamy shower or being outside as this may cause further irritation to your rash  You may follow-up with his urgent care as needed if symptoms persist or worsen

## 2022-11-10 NOTE — Telephone Encounter (Addendum)
Nikki her sister has called reporting that patient has had rash for a week and it is getting worse and it is burning.

## 2022-11-10 NOTE — Telephone Encounter (Signed)
I spoke with Lexine Baton and advised she take her to Urgent [facility]. She has agreed to take her

## 2022-11-10 NOTE — Telephone Encounter (Signed)
Patient called reporting that she has a rash from head to toe, She is asking for a return call to tell her what is going on.  Should I tell her to go to Urgent Care?

## 2022-11-10 NOTE — ED Provider Notes (Signed)
[facility]    CSN: [id number] Arrival date & time: 11/10/22  1118      History   Chief Complaint Chief Complaint  Patient presents with   Rash    HPI Marilyn Hendricks is a 47 y.o. female.   Patient has a erythematous pruritic rash generalized to the body for 7 days .  Initially began around mouth but has since spread.  Skin around the mouth is described as a sensation of feeling raw and irritated, intermittently burning. Symptoms are worsened by heat. Denies respiratory symptoms, shortness of breath, wheezing.  Denies in toiletries, dietary changes or recent travel.  Within the mouth has taken doxycycline, cefdinir and prednisone for management of the COPD exacerbation.  Currently completing workup with oncology for cancer.  Has attempted use of Benadryl which has been ineffective.  Denies drainage or fevers.    Past Medical History:  Diagnosis Date   ETD (eustachian tube dysfunction)    GERD (gastroesophageal reflux disease)     Patient Active Problem List   Diagnosis Date Noted   Lymphocytosis 11/02/2022   Cyst of skin 01/26/2018   Encounter for screening mammogram for breast cancer 01/26/2018   Obstructive chronic bronchitis without exacerbation 04/09/2014   Tobacco abuse 04/09/2014   Marijuana abuse 04/09/2014    Past Surgical History:  Procedure Laterality Date   CHOLECYSTECTOMY     TUBAL LIGATION      OB History     Gravida  5   Para  3   Term      Preterm      AB  1   Living  3      SAB  1   IAB      Ectopic      Multiple  3   Live Births  3        Obstetric Comments  1st Menstrual Cycle:  12  1st Pregnancy:  14           Home Medications    Prior to Admission medications   Medication Sig Start Date End Date Taking? Authorizing Provider  acetaminophen (TYLENOL) 325 MG tablet Take 650 mg by mouth every 6 (six) hours as needed.    [provider]  albuterol (VENTOLIN HFA) 108 (90 Base) MCG/ACT inhaler  Inhale 1-2 puffs into the lungs every 6 (six) hours as needed for wheezing or shortness of breath. 10/18/22   Thersa Salt G, DO  ipratropium (ATROVENT HFA) 17 MCG/ACT inhaler Inhale 2 puffs into the lungs every 4 (four) hours as needed for wheezing. 10/24/22   Brimage, Ronnette Juniper, DO  predniSONE (STERAPRED UNI-PAK 21 TAB) 10 MG (21) TBPK tablet Take by mouth daily. Take 6 tabs by mouth daily  for 2 days, then 5 tabs for 2 days, then 4 tabs for 2 days, then 3 tabs for 2 days, 2 tabs for 2 days, then 1 tab by mouth daily for 2 days 10/24/22   Lyndee Hensen, DO  promethazine-dextromethorphan (PROMETHAZINE-DM) 6.25-15 MG/5ML syrup Take 5 mLs by mouth 4 (four) times daily as needed. 10/19/22   Margarette Canada, NP    Family History Family History  Problem Relation Age of Onset   Breast cancer Mother 50       double mastectomy in 1994   Asthma Mother    Esophagitis Father    Cancer Maternal Grandfather        prostate    Social History Social History   Tobacco Use   Smoking status:  Every Day    Packs/day: 0.50    Years: 26.00    Total pack years: 13.00    Types: Cigarettes   Smokeless tobacco: Never  Vaping Use   Vaping Use: Some days   Substances: Flavoring  Substance Use Topics   Alcohol use: No   Drug use: Yes    Frequency: 7.0 times per week    Types: Marijuana    Comment: last night used     Allergies   Doxycycline, Beeswax, Carrot [daucus carota], Cats claw [uncaria tomentosa (cats claw)], and Erythromycin   Review of Systems Review of Systems  Constitutional: Negative.   Respiratory: Negative.    Cardiovascular: Negative.   Skin:  Positive for rash. Negative for color change, pallor and wound.     Physical Exam Triage Vital Signs ED Triage Vitals  Enc Vitals Group     BP 11/10/22 1213 122/84     Pulse Rate 11/10/22 1213 81     Resp 11/10/22 1213 16     Temp 11/10/22 1213 97.9 F (36.6 C)     Temp Source 11/10/22 1213 Oral     SpO2 11/10/22 1213 97 %     Weight  11/10/22 1212 174 lb 9.7 oz (79.2 kg)     Height 11/10/22 1212 5' 1.5" (1.562 m)     Head Circumference --      Peak Flow --      Pain Score 11/10/22 1210 8     Pain Loc --      Pain Edu? --      Excl. in [place]? --    No data found.  Updated Vital Signs BP 122/84 (BP Location: Left Arm)   Pulse 81   Temp 97.9 F (36.6 C) (Oral)   Resp 16   Ht 5' 1.5" (1.562 m)   Wt 174 lb 9.7 oz (79.2 kg)   SpO2 97%   BMI 32.46 kg/m   Visual Acuity Right Eye Distance:   Left Eye Distance:   Bilateral Distance:    Right Eye Near:   Left Eye Near:    Bilateral Near:     Physical Exam Constitutional:      Appearance: Normal appearance.  HENT:     Mouth/Throat:     Mouth: Mucous membranes are moist.     Pharynx: Oropharynx is clear.  Eyes:     Extraocular Movements: Extraocular movements intact.  Pulmonary:     Effort: Pulmonary effort is normal.  Skin:    Comments: Erythematous macular papular rash generalized to the body  Neurological:     Mental Status: She is alert and oriented to person, place, and time. Mental status is at baseline.      UC Treatments / Results  Labs (all labs ordered are listed, but only abnormal results are displayed) Labs Reviewed - No data to display  EKG   Radiology No results found.  Procedures Procedures (including critical care time)  Medications Ordered in UC Medications - No data to display  Initial Impression / Assessment and Plan / UC Course  I have reviewed the triage vital signs and the nursing notes.  Pertinent labs & imaging results that were available during my care of the patient were reviewed by me and considered in my medical decision making (see chart for details).  Rash  Unknown etiology, had completed all new medications prior to symptoms beginning, appears inflammatory response, no current signs of infection, will avoid use of prednisone as she endorses medication made  her feel jittery however will attempt use of  methylprednisolone as she has not used before, Decadron injection given in office and prescribed hydroxyzine for management of pruritus, may use topical antihistamines and calamine lotion for additional support, advised against long exposure to heat to prevent further irritation, given strict precautions that if symptoms persist or worsen she is to follow-up for reevaluation Final Clinical Impressions(s) / UC Diagnoses   Final diagnoses:  None   Discharge Instructions   None    ED Prescriptions   None    PDMP not reviewed this encounter.   Hans Eden, NP 11/10/22 1350

## 2022-11-10 NOTE — ED Triage Notes (Signed)
Pt has red, raised, itchy rash from head to toe. She states the rash is burning and is swelling. Started about 2 week ago.

## 2022-11-16 ENCOUNTER — Telehealth: Payer: Self-pay

## 2022-11-16 ENCOUNTER — Other Ambulatory Visit: Payer: Self-pay | Admitting: Internal Medicine

## 2022-11-16 DIAGNOSIS — R140 Abdominal distension (gaseous): Secondary | ICD-10-CM

## 2022-11-16 NOTE — Telephone Encounter (Signed)
Patient is scheduled for CT tomorrow (11/17/22) unfortunately insurance has not approved the CT scan.  Dr. B recommends cancel CT and schedule [place].    Left message informing patient for need of imaging change.  Will also send MyChart message.  CC scheduling--please cancel the CT for 11/17/22 and schedule [place].

## 2022-11-17 ENCOUNTER — Other Ambulatory Visit: Payer: Medicaid Other

## 2022-11-17 ENCOUNTER — Ambulatory Visit: Admission: RE | Admit: 2022-11-17 | Payer: Medicaid Other | Source: Ambulatory Visit

## 2022-11-17 ENCOUNTER — Ambulatory Visit
Admission: RE | Admit: 2022-11-17 | Discharge: 2022-11-17 | Disposition: A | Discharge status: Home / Self Care | Payer: Medicaid Other | Source: Ambulatory Visit | Attending: Internal Medicine | Admitting: Internal Medicine

## 2022-11-17 DIAGNOSIS — R140 Abdominal distension (gaseous): Secondary | ICD-10-CM | POA: Diagnosis present

## 2022-11-26 ENCOUNTER — Inpatient Hospital Stay: Payer: Medicaid Other | Admitting: Internal Medicine

## 2022-11-26 VITALS — BP 136/95 | HR 85 | Temp 97.3°F | Resp 18 | Wt 177.8 lb

## 2022-11-26 DIAGNOSIS — D72820 Lymphocytosis (symptomatic): Secondary | ICD-10-CM | POA: Diagnosis not present

## 2022-11-26 NOTE — Assessment & Plan Note (Addendum)
#  White count-/mild lymphocytosis/neutrophilia/monocytosis-likely reactive- ?  Secondary to smoking versus others-obesity-insulin resistance/intermittent steroids.  CRP mildly elevated.  Peripheral blood flow cytometry-negative for any monoclonal process; BCR-ABL; MPN panel-negative for any monoclonal process.  Would not recommend a bone marrow biopsy at this time.   # Abnormal weight gain/along with abdominal bloating/distention- Ultrasound abdomen- FEB 2024-fatty liver; otherwise no acute process or ascites.  Discussed importance of healthy weight/and weight loss.  Strongly recommend eating more green leafy vegetables and cutting down processed food/ carbohydrates.  Instead increasing whole grains / protein in the diet.  Multiple studies have shown that optimal weight would help improve cardiovascular risk; also shown to cut on the risk of malignancies-colon cancer, breast cancer ovarian/uterine cancer in women.  Defer to PCP for further evaluation/recommendations.  # Smoking: Discussed with the patient regarding the ill effects of smoking- including but not limited to cardiac lung and vascular diseases and malignancies. Counseled against smoking.  Discussed smoking is one potential reason for elevated white count.   # DISPOSITION:  # follow up as needed- Dr.B

## 2022-11-26 NOTE — Progress Notes (Signed)
Pt went to ED for Rash. She was given a steroid taper and rash is gone. Having headaches. Right side of head and down right side of neck. CT scan denied. Had U/S instead.

## 2022-11-26 NOTE — Progress Notes (Signed)
[facility] OFFICE PROGRESS NOTE  Patient Care Team: Patient, No Pcp Per as PCP - General (General Practice) Rico Junker, RN as Registered Nurse Theodore Demark, RN (Inactive) as Registered Nurse   # HEMATOLOGY HISTORY:  # LEUCOCYTOSIS- WBC-; N; L; Hb- platelets   Oncology History   No history exists.    INTERVAL HISTORY: Accompanied by her mother.  Sister; hospice nurse on the phone.  Marilyn Hendricks 46 y.o.  female pleasant patient history of active smoking-is here for a follow up for mild to moderate leukocytosis/ultrasound abdomen.  Pt went to ED for Rash. She was given a steroid taper and rash is gone. Having headaches. Right side of head and down right side of neck.   Patient continues to complain of weight gain.  Review of Systems  Constitutional:  Positive for malaise/fatigue. Negative for chills, diaphoresis, fever and weight loss.  HENT:  Negative for nosebleeds and sore throat.   Eyes:  Negative for double vision.  Respiratory:  Negative for cough, hemoptysis, sputum production, shortness of breath and wheezing.   Cardiovascular:  Negative for chest pain, palpitations, orthopnea and leg swelling.  Gastrointestinal:  Negative for abdominal pain, blood in stool, constipation, diarrhea, heartburn, melena, nausea and vomiting.  Genitourinary:  Negative for dysuria, frequency and urgency.  Musculoskeletal:  Negative for back pain and joint pain.  Skin:  Positive for itching. Negative for rash.  Neurological:  Positive for headaches. Negative for dizziness, tingling, focal weakness and weakness.  Endo/Heme/Allergies:  Does not bruise/bleed easily.  Psychiatric/Behavioral:  Negative for depression. The patient is not nervous/anxious and does not have insomnia.       PAST MEDICAL HISTORY :  Past Medical History:  Diagnosis Date   ETD (eustachian tube dysfunction)    GERD (gastroesophageal reflux disease)     PAST SURGICAL HISTORY :   Past  Surgical History:  Procedure Laterality Date   CHOLECYSTECTOMY     TUBAL LIGATION      FAMILY HISTORY :   Family History  Problem Relation Age of Onset   Breast cancer Mother 24       double mastectomy in 1994   Asthma Mother    Esophagitis Father    Cancer Maternal Grandfather        prostate    SOCIAL HISTORY:   Social History   Tobacco Use   Smoking status: Every Day    Packs/day: 0.50    Years: 26.00    Total pack years: 13.00    Types: Cigarettes   Smokeless tobacco: Never  Vaping Use   Vaping Use: Some days   Substances: Flavoring  Substance Use Topics   Alcohol use: No   Drug use: Yes    Frequency: 7.0 times per week    Types: Marijuana    Comment: last night used    ALLERGIES:  is allergic to doxycycline, beeswax, carrot [daucus carota], cats claw [uncaria tomentosa (cats claw)], and erythromycin.  MEDICATIONS:  Current Outpatient Medications  Medication Sig Dispense Refill   acetaminophen (TYLENOL) 325 MG tablet Take 650 mg by mouth every 6 (six) hours as needed.     albuterol (VENTOLIN HFA) 108 (90 Base) MCG/ACT inhaler Inhale 1-2 puffs into the lungs every 6 (six) hours as needed for wheezing or shortness of breath. 18 g 0   hydrOXYzine (ATARAX) 25 MG tablet Take 1 tablet (25 mg total) by mouth every 6 (six) hours. 12 tablet 0   ipratropium (ATROVENT HFA) 17 MCG/ACT  inhaler Inhale 2 puffs into the lungs every 4 (four) hours as needed for wheezing. 1 each 12   ketorolac (TORADOL) 10 MG tablet Take 10 mg by mouth every 6 (six) hours as needed.     promethazine-dextromethorphan (PROMETHAZINE-DM) 6.25-15 MG/5ML syrup Take 5 mLs by mouth 4 (four) times daily as needed. (Patient not taking: Reported on 11/26/2022) 118 mL 0   No current facility-administered medications for this visit.    PHYSICAL EXAMINATION:  BP (!) 136/95 (BP Location: Left Arm, Patient Position: Sitting)   Pulse 85   Temp (!) 97.3 F (36.3 C) (Tympanic)   Resp 18   Wt 177 lb 12.8 oz  (80.6 kg)   SpO2 100%   BMI 33.05 kg/m   Filed Weights   11/26/22 0928  Weight: 177 lb 12.8 oz (80.6 kg)    Physical Exam Vitals and nursing note reviewed.  HENT:     Head: Normocephalic and atraumatic.     Mouth/Throat:     Pharynx: Oropharynx is clear.  Eyes:     Extraocular Movements: Extraocular movements intact.     Pupils: Pupils are equal, round, and reactive to light.  Cardiovascular:     Rate and Rhythm: Normal rate and regular rhythm.  Pulmonary:     Comments: Decreased breath sounds bilaterally.  Abdominal:     Palpations: Abdomen is soft.  Musculoskeletal:        General: Normal range of motion.     Cervical back: Normal range of motion.  Skin:    General: Skin is warm.  Neurological:     General: No focal deficit present.     Mental Status: She is alert and oriented to person, place, and time.  Psychiatric:        Behavior: Behavior normal.        Judgment: Judgment normal.        LABORATORY DATA:  I have reviewed the data as listed    Component Value Date/Time   NA 135 11/02/2022 1506   NA 143 06/24/2012 1804   K 3.9 11/02/2022 1506   K 3.7 06/24/2012 1804   CL 100 11/02/2022 1506   CL 109 (H) 06/24/2012 1804   CO2 26 11/02/2022 1506   CO2 26 06/24/2012 1804   GLUCOSE 90 11/02/2022 1506   GLUCOSE 95 06/24/2012 1804   BUN 13 11/02/2022 1506   BUN 5 (L) 06/24/2012 1804   CREATININE 0.87 11/02/2022 1506   CREATININE 0.67 06/24/2012 1804   CALCIUM 9.0 11/02/2022 1506   CALCIUM 9.2 06/24/2012 1804   PROT 7.0 11/02/2022 1506   PROT 7.6 06/24/2012 1804   ALBUMIN 4.0 11/02/2022 1506   ALBUMIN 3.9 06/24/2012 1804   AST 28 11/02/2022 1506   AST 21 06/24/2012 1804   ALT 51 (H) 11/02/2022 1506   ALT 21 06/24/2012 1804   ALKPHOS 75 11/02/2022 1506   ALKPHOS 84 06/24/2012 1804   BILITOT 0.4 11/02/2022 1506   BILITOT 0.5 06/24/2012 1804   GFRNONAA >60 11/02/2022 1506   GFRNONAA >60 06/24/2012 1804   GFRAA >60 10/29/2019 1422   GFRAA >60  06/24/2012 1804    No results found for: "SPEP", "UPEP"  Lab Results  Component Value Date   WBC 12.7 (H) 11/02/2022   NEUTROABS 7.0 11/02/2022   HGB 14.2 11/02/2022   HCT 41.6 11/02/2022   MCV 94.1 11/02/2022   PLT 219 11/02/2022      Chemistry      Component Value Date/Time   NA 135  11/02/2022 1506   NA 143 06/24/2012 1804   K 3.9 11/02/2022 1506   K 3.7 06/24/2012 1804   CL 100 11/02/2022 1506   CL 109 (H) 06/24/2012 1804   CO2 26 11/02/2022 1506   CO2 26 06/24/2012 1804   BUN 13 11/02/2022 1506   BUN 5 (L) 06/24/2012 1804   CREATININE 0.87 11/02/2022 1506   CREATININE 0.67 06/24/2012 1804      Component Value Date/Time   CALCIUM 9.0 11/02/2022 1506   CALCIUM 9.2 06/24/2012 1804   ALKPHOS 75 11/02/2022 1506   ALKPHOS 84 06/24/2012 1804   AST 28 11/02/2022 1506   AST 21 06/24/2012 1804   ALT 51 (H) 11/02/2022 1506   ALT 21 06/24/2012 1804   BILITOT 0.4 11/02/2022 1506   BILITOT 0.5 06/24/2012 1804       RADIOGRAPHIC STUDIES: I have personally reviewed the radiological images as listed and agreed with the findings in the report. No results found.   ASSESSMENT & PLAN:  Lymphocytosis #White count-/mild lymphocytosis/neutrophilia/monocytosis-likely reactive- ?  Secondary to smoking versus others-obesity-insulin resistance/intermittent steroids.  CRP mildly elevated.  Peripheral blood flow cytometry-negative for any monoclonal process; BCR-ABL; MPN panel-negative for any monoclonal process.  Would not recommend a bone marrow biopsy at this time.   # Abnormal weight gain/along with abdominal bloating/distention- Ultrasound abdomen- FEB 2024-fatty liver; otherwise no acute process or ascites.  Discussed importance of healthy weight/and weight loss.  Strongly recommend eating more green leafy vegetables and cutting down processed food/ carbohydrates.  Instead increasing whole grains / protein in the diet.  Multiple studies have shown that optimal weight would help  improve cardiovascular risk; also shown to cut on the risk of malignancies-colon cancer, breast cancer ovarian/uterine cancer in women.  Defer to PCP for further evaluation/recommendations.  # Smoking: Discussed with the patient regarding the ill effects of smoking- including but not limited to cardiac lung and vascular diseases and malignancies. Counseled against smoking.  Discussed smoking is one potential reason for elevated white count.   # DISPOSITION:  # follow up as needed- Dr.B  No orders of the defined types were placed in this encounter.  All questions were answered. The patient knows to call the clinic with any problems, questions or concerns.      Cammie Sickle, MD 11/26/2022 10:35 AM

## 2023-01-14 ENCOUNTER — Other Ambulatory Visit: Payer: Self-pay | Admitting: Family Medicine

## 2023-01-14 DIAGNOSIS — Z1231 Encounter for screening mammogram for malignant neoplasm of breast: Secondary | ICD-10-CM

## 2023-01-18 ENCOUNTER — Inpatient Hospital Stay
Admission: RE | Admit: 2023-01-18 | Discharge: 2023-01-18 | Disposition: A | Discharge status: Home / Self Care | Payer: Self-pay | Source: Ambulatory Visit | Attending: Orthopedic Surgery | Admitting: Orthopedic Surgery

## 2023-01-18 ENCOUNTER — Other Ambulatory Visit: Payer: Self-pay

## 2023-01-18 DIAGNOSIS — Z049 Encounter for examination and observation for unspecified reason: Secondary | ICD-10-CM

## 2023-01-27 ENCOUNTER — Ambulatory Visit: Payer: Medicaid Other | Admitting: Orthopedic Surgery

## 2023-02-04 ENCOUNTER — Ambulatory Visit: Payer: Medicaid Other
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
## 2023-02-09 ENCOUNTER — Encounter: Payer: Self-pay | Admitting: Orthopedic Surgery

## 2023-02-09 ENCOUNTER — Ambulatory Visit (INDEPENDENT_AMBULATORY_CARE_PROVIDER_SITE_OTHER): Payer: Medicaid Other | Admitting: Orthopedic Surgery

## 2023-02-09 DIAGNOSIS — M5416 Radiculopathy, lumbar region: Secondary | ICD-10-CM

## 2023-02-09 DIAGNOSIS — M5412 Radiculopathy, cervical region: Secondary | ICD-10-CM

## 2023-02-09 DIAGNOSIS — M4722 Other spondylosis with radiculopathy, cervical region: Secondary | ICD-10-CM

## 2023-02-09 DIAGNOSIS — M5442 Lumbago with sciatica, left side: Secondary | ICD-10-CM | POA: Diagnosis not present

## 2023-02-09 DIAGNOSIS — M542 Cervicalgia: Secondary | ICD-10-CM

## 2023-02-09 DIAGNOSIS — M47812 Spondylosis without myelopathy or radiculopathy, cervical region: Secondary | ICD-10-CM

## 2023-02-09 DIAGNOSIS — M5441 Lumbago with sciatica, right side: Secondary | ICD-10-CM | POA: Diagnosis not present

## 2023-02-09 DIAGNOSIS — R519 Headache, unspecified: Secondary | ICD-10-CM

## 2023-02-09 DIAGNOSIS — G8929 Other chronic pain: Secondary | ICD-10-CM

## 2023-02-09 NOTE — Patient Instructions (Signed)
It was so nice to see you today. Thank you so much for coming in.    Your neck xrays from 2020 showed some wear and tear in your neck.   I want to get an MRI of your neck and lower back to look into things further. We will get this approved through your insurance and [facility] will call you to schedule the appointment.   I want you to see neurology at the [facility] clinic for further evaluation of your headaches. They should call you to schedule an appointment or you can call them at [phone].   Once I have the MRI results back, we will call you to schedule a phone visit with me to review them.   Please do not hesitate to call if you have any questions or concerns. You can also message me in MyChart.   Drake Leach PA-C [phone]

## 2023-02-14 ENCOUNTER — Ambulatory Visit
Admission: RE | Admit: 2023-02-14 | Discharge: 2023-02-14 | Disposition: A | Discharge status: Home / Self Care | Payer: Medicaid Other | Source: Ambulatory Visit | Attending: Orthopedic Surgery | Admitting: Orthopedic Surgery

## 2023-02-14 DIAGNOSIS — M5412 Radiculopathy, cervical region: Secondary | ICD-10-CM | POA: Insufficient documentation

## 2023-02-14 DIAGNOSIS — M542 Cervicalgia: Secondary | ICD-10-CM

## 2023-02-14 DIAGNOSIS — M5441 Lumbago with sciatica, right side: Secondary | ICD-10-CM | POA: Insufficient documentation

## 2023-02-14 DIAGNOSIS — M47812 Spondylosis without myelopathy or radiculopathy, cervical region: Secondary | ICD-10-CM

## 2023-02-14 DIAGNOSIS — G8929 Other chronic pain: Secondary | ICD-10-CM | POA: Diagnosis not present

## 2023-02-14 DIAGNOSIS — M5416 Radiculopathy, lumbar region: Secondary | ICD-10-CM | POA: Diagnosis present

## 2023-02-14 DIAGNOSIS — M5442 Lumbago with sciatica, left side: Secondary | ICD-10-CM | POA: Diagnosis present

## 2023-02-17 DIAGNOSIS — R7303 Prediabetes: Secondary | ICD-10-CM | POA: Insufficient documentation
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
## 2023-02-26 ENCOUNTER — Encounter: Payer: Self-pay | Admitting: Orthopedic Surgery

## 2023-02-26 ENCOUNTER — Ambulatory Visit (INDEPENDENT_AMBULATORY_CARE_PROVIDER_SITE_OTHER): Payer: Medicaid Other | Admitting: Orthopedic Surgery

## 2023-02-26 DIAGNOSIS — M5442 Lumbago with sciatica, left side: Secondary | ICD-10-CM

## 2023-02-26 DIAGNOSIS — M4722 Other spondylosis with radiculopathy, cervical region: Secondary | ICD-10-CM | POA: Diagnosis not present

## 2023-02-26 DIAGNOSIS — M47816 Spondylosis without myelopathy or radiculopathy, lumbar region: Secondary | ICD-10-CM | POA: Diagnosis not present

## 2023-02-26 DIAGNOSIS — G8929 Other chronic pain: Secondary | ICD-10-CM

## 2023-02-26 DIAGNOSIS — M47812 Spondylosis without myelopathy or radiculopathy, cervical region: Secondary | ICD-10-CM

## 2023-02-26 DIAGNOSIS — M5412 Radiculopathy, cervical region: Secondary | ICD-10-CM

## 2023-02-26 DIAGNOSIS — M5441 Lumbago with sciatica, right side: Secondary | ICD-10-CM | POA: Diagnosis not present

## 2023-02-26 NOTE — Addendum Note (Signed)
Addended byDrake Leach on: 02/26/2023 08:27 AM   Modules accepted: Orders

## 2023-03-02 ENCOUNTER — Encounter: Payer: Self-pay | Admitting: Student in an Organized Health Care Education/Training Program

## 2023-03-02 ENCOUNTER — Ambulatory Visit
Payer: Medicaid Other | Attending: Student in an Organized Health Care Education/Training Program | Admitting: Student in an Organized Health Care Education/Training Program

## 2023-03-02 VITALS — BP 124/89 | HR 88 | Temp 97.2°F | Ht 60.0 in | Wt 178.0 lb

## 2023-03-02 DIAGNOSIS — M5412 Radiculopathy, cervical region: Secondary | ICD-10-CM | POA: Diagnosis present

## 2023-03-02 DIAGNOSIS — M47816 Spondylosis without myelopathy or radiculopathy, lumbar region: Secondary | ICD-10-CM | POA: Diagnosis present

## 2023-03-02 DIAGNOSIS — G894 Chronic pain syndrome: Secondary | ICD-10-CM | POA: Diagnosis present

## 2023-03-02 DIAGNOSIS — M47812 Spondylosis without myelopathy or radiculopathy, cervical region: Secondary | ICD-10-CM | POA: Diagnosis present
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
## 2023-03-02 NOTE — Progress Notes (Signed)
Safety precautions to be maintained throughout the outpatient stay will include: orient to surroundings, keep bed in low position, maintain call bell within reach at all times, provide assistance with transfer out of bed and ambulation.  

## 2023-03-09 ENCOUNTER — Ambulatory Visit
Admission: RE | Admit: 2023-03-09 | Discharge: 2023-03-09 | Disposition: A | Discharge status: Home / Self Care | Payer: Medicaid Other | Source: Ambulatory Visit | Attending: Family Medicine | Admitting: Family Medicine

## 2023-03-09 DIAGNOSIS — Z1231 Encounter for screening mammogram for malignant neoplasm of breast: Secondary | ICD-10-CM | POA: Diagnosis present

## 2023-03-10 ENCOUNTER — Encounter: Payer: Self-pay | Admitting: Family Medicine

## 2023-03-11 ENCOUNTER — Telehealth: Payer: Self-pay

## 2023-03-11 DIAGNOSIS — M47816 Spondylosis without myelopathy or radiculopathy, lumbar region: Secondary | ICD-10-CM

## 2023-03-11 NOTE — Telephone Encounter (Signed)
Insurance denied auth for lumbar facets due to no PT. She says she will go to PT if you will order it. Please let me know if you put in an order. Thanks

## 2023-03-12 ENCOUNTER — Ambulatory Visit
Admission: RE | Admit: 2023-03-12 | Discharge: 2023-03-12 | Disposition: A | Discharge status: Home / Self Care | Payer: Medicaid Other | Source: Ambulatory Visit | Attending: Family Medicine | Admitting: Family Medicine

## 2023-03-12 ENCOUNTER — Other Ambulatory Visit: Payer: Self-pay | Admitting: Family Medicine

## 2023-03-12 DIAGNOSIS — R928 Other abnormal and inconclusive findings on diagnostic imaging of breast: Secondary | ICD-10-CM

## 2023-03-12 DIAGNOSIS — N6489 Other specified disorders of breast: Secondary | ICD-10-CM

## 2023-03-30 ENCOUNTER — Ambulatory Visit
Payer: Medicaid Other | Attending: Student in an Organized Health Care Education/Training Program | Admitting: Physical Therapy

## 2023-03-30 ENCOUNTER — Other Ambulatory Visit: Payer: Self-pay

## 2023-03-30 DIAGNOSIS — M47816 Spondylosis without myelopathy or radiculopathy, lumbar region: Secondary | ICD-10-CM | POA: Insufficient documentation

## 2023-03-30 DIAGNOSIS — M5459 Other low back pain: Secondary | ICD-10-CM | POA: Diagnosis present
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
## 2023-03-31 ENCOUNTER — Ambulatory Visit: Payer: Medicaid Other | Admitting: Physical Therapy

## 2023-04-06 ENCOUNTER — Ambulatory Visit: Payer: Medicaid Other | Admitting: Physical Therapy

## 2023-04-06 ENCOUNTER — Encounter (INDEPENDENT_AMBULATORY_CARE_PROVIDER_SITE_OTHER): Payer: Self-pay | Admitting: Vascular Surgery

## 2023-04-13 ENCOUNTER — Ambulatory Visit: Payer: Medicaid Other | Admitting: Physical Therapy

## 2023-04-13 DIAGNOSIS — M5459 Other low back pain: Secondary | ICD-10-CM | POA: Diagnosis not present
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
## 2023-04-15 ENCOUNTER — Encounter: Payer: Medicaid Other | Admitting: Physical Therapy
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
## 2023-04-19 ENCOUNTER — Ambulatory Visit: Payer: Medicaid Other | Admitting: Orthopedic Surgery

## 2023-04-20 ENCOUNTER — Telehealth: Payer: Self-pay | Admitting: Physical Therapy

## 2023-04-20 ENCOUNTER — Ambulatory Visit: Payer: Medicaid Other | Admitting: Physical Therapy

## 2023-04-20 NOTE — Telephone Encounter (Signed)
Called pt to inquire about absence from physical therapy. Did not reach so left VM instructing pt to return call and reschedule?

## 2023-04-22 ENCOUNTER — Encounter: Payer: Medicaid Other | Admitting: Physical Therapy

## 2023-04-22 ENCOUNTER — Ambulatory Visit: Payer: Medicaid Other | Admitting: Physical Therapy

## 2023-04-23 ENCOUNTER — Encounter (INDEPENDENT_AMBULATORY_CARE_PROVIDER_SITE_OTHER): Payer: Self-pay | Admitting: Vascular Surgery

## 2023-04-24 ENCOUNTER — Other Ambulatory Visit: Payer: Self-pay

## 2023-04-24 ENCOUNTER — Emergency Department
Admission: EM | Admit: 2023-04-24 | Discharge: 2023-04-24 | Disposition: A | Discharge status: Home / Self Care | Payer: Medicaid Other | Attending: Emergency Medicine | Admitting: Emergency Medicine

## 2023-04-24 DIAGNOSIS — X500XXA Overexertion from strenuous movement or load, initial encounter: Secondary | ICD-10-CM | POA: Diagnosis not present

## 2023-04-24 DIAGNOSIS — Y990 Civilian activity done for income or pay: Secondary | ICD-10-CM | POA: Insufficient documentation

## 2023-04-24 DIAGNOSIS — S161XXA Strain of muscle, fascia and tendon at neck level, initial encounter: Secondary | ICD-10-CM | POA: Insufficient documentation

## 2023-04-24 DIAGNOSIS — M79602 Pain in left arm: Secondary | ICD-10-CM

## 2023-04-24 DIAGNOSIS — M25512 Pain in left shoulder: Secondary | ICD-10-CM | POA: Diagnosis present

## 2023-04-24 MED ORDER — IBUPROFEN 800 MG PO TABS: 800.0000 mg | ORAL_TABLET | Freq: Three times a day (TID) | ORAL | 0 refills | Status: DC | PRN

## 2023-04-24 MED ORDER — LIDOCAINE 5 % EX PTCH: 1.0000 | MEDICATED_PATCH | Freq: Two times a day (BID) | CUTANEOUS | 0 refills | Status: AC | PRN

## 2023-04-24 MED ORDER — KETOROLAC TROMETHAMINE 30 MG/ML IJ SOLN
30.0000 mg | Freq: Once | INTRAMUSCULAR | Status: AC
  Administered 2023-04-24: 30 mg via INTRAMUSCULAR
  Filled 2023-04-24: qty 1

## 2023-04-24 MED ORDER — CYCLOBENZAPRINE HCL 5 MG PO TABS: 5.0000 mg | ORAL_TABLET | Freq: Three times a day (TID) | ORAL | 0 refills | Status: DC | PRN

## 2023-04-24 NOTE — ED Triage Notes (Addendum)
Pt to ed from home via POV for numbness and tingling in left and right leg x 1 month. Pt has had several apts with vein and vascular but they have continued to keep cancelling and rescheduling her apts. Pt is caox4, in no acute distress and ambulatory in triage. Pt advised she hasn't seen a doctor in 20 years until Feb this year. Pt Neg for stroke screen in triage and keeps going on  and on about her legs and how this could be all sorts of complications.

## 2023-04-24 NOTE — Discharge Instructions (Signed)
Take the prescription meds as directed. Follow-up with your primary provider or specialists as discussed.

## 2023-04-24 NOTE — ED Provider Notes (Signed)
[facility] Emergency Department Provider Note     Event Date/Time   First MD Initiated Contact with Patient 04/24/23 1713     (approximate)   History   Leg Tingling (X 1 month) and Arm Tingling   HPI  Marilyn Hendricks is a 47 y.o. right handed female presents to the ED from home for evaluation of some left shoulder pain as well as some upper extremity tingling for the last month or so.  Patient has had several appointments with the main vascular specialist canceled, but reports she is scheduled to have an outpatient angiogram to evaluate for possible abdominal aortic aneurysm, based on family history.  Patient would report otherwise negative for workups in the past.  She has a history of cervical DDD with previous MRI imaging.  She is currently been referred to pain management for treatment of her lumbar DDD.  She denies any chest pain, shortness of breath, cough, or congestion.  She does endorse shoulder pain with onset after doing some heavy lifting activities at work.  Patient denies any grip changes, skin temp/color changes, or weakness.  Pain is aggravated by range of motion of the left shoulder, and she localizes some discomfort to the upper left trapezius musculature.     Physical Exam   Triage Vital Signs: ED Triage Vitals  Encounter Vitals Group     BP 04/24/23 1647 110/77     Systolic BP Percentile --      Diastolic BP Percentile --      Pulse Rate 04/24/23 1647 80     Resp 04/24/23 1647 16     Temp 04/24/23 1647 98 F (36.7 C)     Temp Source 04/24/23 1647 Oral     SpO2 04/24/23 1647 98 %     Weight --      Height 04/24/23 1646 5' (1.524 m)     Head Circumference --      Peak Flow --      Pain Score 04/24/23 1645 0     Pain Loc --      Pain Education --      Exclude from Growth Chart --     Most recent vital signs: Vitals:   04/24/23 1647  BP: 110/77  Pulse: 80  Resp: 16  Temp: 98 F (36.7 C)  SpO2: 98%    General Awake, no  distress. NAD HEENT NCAT. PERRL. EOMI. No rhinorrhea. Mucous membranes are moist.  CV:  Good peripheral perfusion. RRR RESP:  Normal effort. CTA ABD:  No distention.  MSK:  Left upper extremity and shoulder without obvious deformity, dislocation, or sulcus sign.  Normal composite fist distally.  Normal active range of motion left shoulder without deficit.  Tender to palpation along the upper trapezius musculature.  No rotator cuff deficit on strength testing. NEURO: Cranial nerves II to XII grossly intact.  Normal UE DTRs bilaterally.   ED Results / Procedures / Treatments   Labs (all labs ordered are listed, but only abnormal results are displayed) Labs Reviewed - No data to display   EKG   RADIOLOGY  No results found.   PROCEDURES:  Critical Care performed: No  Procedures   MEDICATIONS ORDERED IN ED: Medications  ketorolac (TORADOL) 30 MG/ML injection 30 mg (30 mg Intramuscular Given 04/24/23 1757)     IMPRESSION / MDM / ASSESSMENT AND PLAN / ED COURSE  I reviewed the triage vital signs and the nursing notes.  Differential diagnosis includes, but is not limited to, shoulder strain, myalgias, muscle spasm, rotator cuff strain, tendinitis, cervical radiculopathy  Patient's presentation is most consistent with acute, uncomplicated illness.  To the ED for evaluation of left shoulder pain and disability over the last few days.  Patient with multiple other complaints, being followed or managed by PCP or specialist.  Patient with reassuring exam overall with no signs of acute neurovasc deficit or internal derangement of the left shoulder.  No evidence of rotator cuff deficit or tendinitis.  Patient with some significant musculoskeletal tenderness over the upper trapezius on the left.  No red flags on exam.  Patient's diagnosis is consistent with trapezius muscle strain as well as possible cervical radiculopathy as a possible etiology. Patient will  be discharged home with prescriptions for cyclobenzaprine, ibuprofen, and Lidoderm patches. Patient is to follow up with her primary provider as needed or otherwise directed. Patient is given ED precautions to return to the ED for any worsening or new symptoms.  FINAL CLINICAL IMPRESSION(S) / ED DIAGNOSES   Final diagnoses:  Cervical strain, acute, initial encounter  Musculoskeletal arm pain, left     Rx / DC Orders   ED Discharge Orders          Ordered    lidocaine (LIDODERM) 5 %  Every 12 hours PRN        04/24/23 1745    cyclobenzaprine (FLEXERIL) 5 MG tablet  3 times daily PRN        04/24/23 1745    ibuprofen (ADVIL) 800 MG tablet  Every 8 hours PRN        04/24/23 1745             Note:  This document was prepared using Dragon voice recognition software and may include unintentional dictation errors.    Lissa Hoard, PA-C 04/26/23 Margette Fast, MD 05/01/23 [phone]

## 2023-04-27 ENCOUNTER — Encounter: Payer: Medicaid Other | Admitting: Physical Therapy

## 2023-04-29 ENCOUNTER — Ambulatory Visit: Payer: Medicaid Other | Admitting: Physical Therapy

## 2023-05-03 ENCOUNTER — Encounter (INDEPENDENT_AMBULATORY_CARE_PROVIDER_SITE_OTHER): Payer: Self-pay | Admitting: Nurse Practitioner

## 2023-05-03 ENCOUNTER — Encounter: Payer: Medicaid Other | Admitting: Physical Therapy

## 2023-05-03 ENCOUNTER — Ambulatory Visit (INDEPENDENT_AMBULATORY_CARE_PROVIDER_SITE_OTHER): Payer: Medicaid Other | Admitting: Nurse Practitioner

## 2023-05-03 VITALS — BP 96/80 | HR 90 | Resp 16 | Wt 178.8 lb

## 2023-05-03 DIAGNOSIS — M79605 Pain in left leg: Secondary | ICD-10-CM

## 2023-05-03 DIAGNOSIS — M47816 Spondylosis without myelopathy or radiculopathy, lumbar region: Secondary | ICD-10-CM | POA: Diagnosis not present

## 2023-05-03 DIAGNOSIS — Z720 Tobacco use: Secondary | ICD-10-CM

## 2023-05-03 DIAGNOSIS — M79604 Pain in right leg: Secondary | ICD-10-CM | POA: Diagnosis not present

## 2023-05-03 NOTE — Progress Notes (Addendum)
Subjective:    Patient ID: Marilyn Hendricks, female    DOB: 1975/12/31, 47 y.o.   MRN: [id number] Chief Complaint  Patient presents with   New Patient (Initial Visit)    Ref Fields consult bilateral leg pain and family hx of aneurysm    The patient is a 47 year old female who presents today for evaluation of pain in her bilateral lower extremities.  She has a family history of abdominal aortic aneurysm as well as peripheral arterial disease.  She notes that she has had this pain going on her legs for years.  She notes that when she goes from a sitting or resting position to standing and walking it is very difficult for her to get started with what she is actually moving she actually moves without issue.  She notes that she has aching in her legs that tend to be worse after standing for several hours.  She experiences leg cramps that happen with activity as well as at rest that go from her thighs all the way down to her feet.  She also notes that her feet hurt at night and she is previously had issues where she has had to wiggle her feet requiring the rest in bed at night.  She denies the development of any rest pain like symptoms.  There are currently no open wounds or ulcerations.  There is no edema.    Review of Systems  Musculoskeletal:  Positive for arthralgias, back pain and neck pain.  All other systems reviewed and are negative.      Objective:   Physical Exam Vitals reviewed.  HENT:     Head: Normocephalic.  Cardiovascular:     Rate and Rhythm: Normal rate.     Pulses:          Dorsalis pedis pulses are 1+ on the right side and 1+ on the left side.       Posterior tibial pulses are 1+ on the right side and 1+ on the left side.  Pulmonary:     Effort: Pulmonary effort is normal.  Skin:    General: Skin is warm and dry.  Neurological:     Mental Status: She is alert and oriented to person, place, and time.  Psychiatric:        Mood and Affect: Mood normal.        Behavior:  Behavior normal.        Thought Content: Thought content normal.        Judgment: Judgment normal.     BP 96/80 (BP Location: Left Arm)   Pulse 90   Resp 16   Wt 178 lb 12.8 oz (81.1 kg)   BMI 34.92 kg/m   Past Medical History:  Diagnosis Date   ETD (eustachian tube dysfunction)    GERD (gastroesophageal reflux disease)     Social History   Socioeconomic History   Marital status: Single    Spouse name: Not on file   Number of children: Not on file   Years of education: Not on file   Highest education level: Not on file  Occupational History   Not on file  Tobacco Use   Smoking status: Every Day    Current packs/day: 0.50    Average packs/day: 0.5 packs/day for 26.0 years (13.0 ttl pk-yrs)    Types: Cigarettes   Smokeless tobacco: Never   Tobacco comments:    Vapes, weed,  Vaping Use   Vaping status: Some Days   Substances: Flavoring  Substance and Sexual Activity   Alcohol use: No   Drug use: Yes    Frequency: 7.0 times per week    Types: Marijuana    Comment: last night used   Sexual activity: Yes    Birth control/protection: Condom  Other Topics Concern   Not on file  Social History Narrative   Not on file   Social Determinants of Health   Financial Resource Strain: Low Risk  (01/14/2023)   Received [facility], [facility]   Overall Financial Resource Strain (CARDIA)    Difficulty of Paying Living Expenses: Not hard at all  Food Insecurity: No Food Insecurity (01/14/2023)   Received from [facility], [facility]   Hunger Vital Sign    Worried About Running Out of Food in the Last Year: Never true    Ran Out of Food in the Last Year: Never true  Recent Concern: Food Insecurity - Food Insecurity Present (11/02/2022)   Hunger Vital Sign    Worried About Running Out of Food in the Last Year: Often true    Ran Out of Food in the Last Year: Often true  Transportation Needs: No  Transportation Needs (01/14/2023)   Received from [facility], [facility]   PRAPARE - Transportation    In the past 12 months, has lack of transportation kept you from medical appointments or from getting medications?: No    Lack of Transportation (Non-Medical): No  Physical Activity: Not on file  Stress: Not on file  Social Connections: Not on file  Intimate Partner Violence: Not At Risk (11/02/2022)   Humiliation, Afraid, Rape, and Kick questionnaire    Fear of Current or Ex-Partner: No    Emotionally Abused: No    Physically Abused: No    Sexually Abused: No    Past Surgical History:  Procedure Laterality Date   CHOLECYSTECTOMY     TUBAL LIGATION      Family History  Problem Relation Age of Onset   Breast cancer Mother 83       double mastectomy in 25   Asthma Mother    Esophagitis Father    Cancer Maternal Grandfather        prostate    Allergies  Allergen Reactions   Doxycycline Nausea And Vomiting   Beeswax    Carrot [Daucus Carota]    Cats Claw [Uncaria Tomentosa (Cats Claw)]    Erythromycin Swelling       Latest Ref Rng & Units 11/02/2022    3:06 PM 10/29/2022   10:20 AM 10/29/2019    2:22 PM  CBC  WBC 4.0 - 10.5 K/uL 12.7  18.2  18.8   Hemoglobin 12.0 - 15.0 g/dL 16.1  09.6  04.5   Hematocrit 36.0 - 46.0 % 41.6  44.2  41.4   Platelets 150 - 400 K/uL 219  251  244       CMP     Component Value Date/Time   NA 135 11/02/2022 1506   NA 143 06/24/2012 1804   K 3.9 11/02/2022 1506   K 3.7 06/24/2012 1804   CL 100 11/02/2022 1506   CL 109 (H) 06/24/2012 1804   CO2 26 11/02/2022 1506   CO2 26 06/24/2012 1804   GLUCOSE 90 11/02/2022 1506   GLUCOSE 95 06/24/2012 1804   BUN 13 11/02/2022 1506   BUN 5 (L) 06/24/2012 1804   CREATININE 0.87 11/02/2022 1506   CREATININE  0.67 06/24/2012 1804   CALCIUM 9.0 11/02/2022 1506   CALCIUM 9.2 06/24/2012 1804   PROT 7.0 11/02/2022 1506   PROT 7.6 06/24/2012 1804   ALBUMIN 4.0  11/02/2022 1506   ALBUMIN 3.9 06/24/2012 1804   AST 28 11/02/2022 1506   AST 21 06/24/2012 1804   ALT 51 (H) 11/02/2022 1506   ALT 21 06/24/2012 1804   ALKPHOS 75 11/02/2022 1506   ALKPHOS 84 06/24/2012 1804   BILITOT 0.4 11/02/2022 1506   BILITOT 0.5 06/24/2012 1804   GFRNONAA >60 11/02/2022 1506   GFRNONAA >60 06/24/2012 1804     No results found.     Assessment & Plan:   1. Leg pain, bilateral  Recommend:  The patient has atypical pain symptoms for pure atherosclerotic disease. However, the patient has a significant smoking history as well as a significant family history of chronic disease.  Further investigation of the patient's vascular disease is necessary to determine the relationship of the patient's lower extremity symptoms and the degree of vascular disease.  Noninvasive studies of the will be obtained and the patient will follow up with me to review these studies.  I suspect the patient is c/o pseudoclaudication.  In addition to ABIs follow-up evaluate her aorta iliac as well to determine if there is any iliac level stenosis.  Additionally will also allow [place] to evaluate and size her abdominal aorta  2. Lumbar spondylosis The patient has known lumbar issues.  Based upon the patient's discussion of symptoms I suspect this is her underlying cause.  3. Tobacco abuse Concern for peripheral arterial disease, smoking cessation is advised   Current Outpatient Medications on File Prior to Visit  Medication Sig Dispense Refill   acetaminophen (TYLENOL) 325 MG tablet Take 650 mg by mouth every 6 (six) hours as needed.     cyclobenzaprine (FLEXERIL) 5 MG tablet Take 1 tablet (5 mg total) by mouth 3 (three) times daily as needed. 15 tablet 0   ibuprofen (ADVIL) 800 MG tablet Take 1 tablet (800 mg total) by mouth every 8 (eight) hours as needed. 30 tablet 0   ketorolac (TORADOL) 10 MG tablet Take 10 mg by mouth every 6 (six) hours as needed.     lidocaine (LIDODERM) 5 % Place  1 patch onto the skin every 12 (twelve) hours as needed for up to 10 days. Remove & Discard patch after 12 hours of wear each day. 10 patch 0   No current facility-administered medications on file prior to visit.    There are no Patient Instructions on file for this visit. No follow-ups on file.   Georgiana Spinner, NP

## 2023-05-04 ENCOUNTER — Encounter: Payer: Medicaid Other | Admitting: Physical Therapy

## 2023-05-06 ENCOUNTER — Encounter: Payer: Medicaid Other | Admitting: Physical Therapy

## 2023-05-10 ENCOUNTER — Encounter: Payer: Medicaid Other | Admitting: Physical Therapy

## 2023-05-12 ENCOUNTER — Encounter: Payer: Medicaid Other | Admitting: Physical Therapy

## 2023-05-13 ENCOUNTER — Telehealth: Payer: Self-pay | Admitting: Physical Therapy

## 2023-05-13 ENCOUNTER — Ambulatory Visit
Payer: Medicaid Other | Attending: Student in an Organized Health Care Education/Training Program | Admitting: Physical Therapy

## 2023-05-13 DIAGNOSIS — M5459 Other low back pain: Secondary | ICD-10-CM | POA: Insufficient documentation

## 2023-05-13 NOTE — Telephone Encounter (Signed)
Called pt to inquire about absence. Pt reports forgetting about apt time and that she is very sorry. She is still feeling the same amount of pain and it has been limiting her from doing exercises. She is going to see vein and vascular physician to determine ongoing pain in her legs. PT provided pt with upcoming apt times and instructed her to call ahead if she cannot make it.

## 2023-05-18 ENCOUNTER — Encounter: Payer: Medicaid Other | Admitting: Physical Therapy

## 2023-05-18 ENCOUNTER — Other Ambulatory Visit (INDEPENDENT_AMBULATORY_CARE_PROVIDER_SITE_OTHER): Payer: Self-pay | Admitting: Nurse Practitioner

## 2023-05-18 DIAGNOSIS — M79604 Pain in right leg: Secondary | ICD-10-CM

## 2023-05-19 ENCOUNTER — Encounter (INDEPENDENT_AMBULATORY_CARE_PROVIDER_SITE_OTHER): Payer: Self-pay | Admitting: Nurse Practitioner

## 2023-05-19 ENCOUNTER — Ambulatory Visit (INDEPENDENT_AMBULATORY_CARE_PROVIDER_SITE_OTHER): Payer: Medicaid Other

## 2023-05-19 ENCOUNTER — Ambulatory Visit (INDEPENDENT_AMBULATORY_CARE_PROVIDER_SITE_OTHER): Payer: Medicaid Other | Admitting: Nurse Practitioner

## 2023-05-19 VITALS — BP 109/82 | HR 95 | Resp 16 | Wt 176.0 lb

## 2023-05-19 DIAGNOSIS — M79605 Pain in left leg: Secondary | ICD-10-CM

## 2023-05-19 DIAGNOSIS — M47816 Spondylosis without myelopathy or radiculopathy, lumbar region: Secondary | ICD-10-CM

## 2023-05-19 DIAGNOSIS — Z720 Tobacco use: Secondary | ICD-10-CM

## 2023-05-19 DIAGNOSIS — M79604 Pain in right leg: Secondary | ICD-10-CM

## 2023-05-19 NOTE — Progress Notes (Signed)
Subjective:    Patient ID: Marilyn Hendricks, female    DOB: 24-Feb-1976, 47 y.o.   MRN: [id number] No chief complaint on file.   Marilyn Hendricks is a 47 year old female who presents today for evaluation of pain in her bilateral lower extremities.  She has a family history of abdominal aortic aneurysm as well as peripheral arterial disease.  She notes that she has had this pain going on her legs for years.  She notes that when she goes from a sitting or resting position to standing and walking it is very difficult for her to get started with what she is actually moving she actually moves without issue.  She notes that she has aching in her legs that tend to be worse after standing for several hours.  She experiences leg cramps that happen with activity as well as at rest that go from her thighs all the way down to her feet.  She also notes that her feet hurt at night and she is previously had issues where she has had to wiggle her feet requiring the rest in bed at night.  She denies the development of any rest pain like symptoms.  There are currently no open wounds or ulcerations.  There is no edema.  Today noninvasive studies show an ABI of 1.25 on the right and 1.25 on the left.  The patient has strong triphasic tibial artery waveforms with normal toe waveforms bilaterally.  Additional aortoiliac duplex shows no evidence of iliac artery occlusion bilaterally with widely patent vessels.  No evidence of abdominal aortic aneurysm with a maximal diameter of 1.91 cm    Review of Systems  Cardiovascular:  Negative for leg swelling.  Musculoskeletal:  Positive for arthralgias and back pain.  All other systems reviewed and are negative.      Objective:   Physical Exam Vitals reviewed.  HENT:     Head: Normocephalic.  Cardiovascular:     Rate and Rhythm: Normal rate.     Pulses:          Dorsalis pedis pulses are 1+ on the right side and 1+ on the left side.       Posterior tibial pulses are 1+ on  the right side and 1+ on the left side.  Pulmonary:     Effort: Pulmonary effort is normal.  Skin:    General: Skin is warm and dry.  Neurological:     Mental Status: She is alert and oriented to person, place, and time.  Psychiatric:        Mood and Affect: Mood normal.        Behavior: Behavior normal.        Thought Content: Thought content normal.        Judgment: Judgment normal.     There were no vitals taken for this visit.  Past Medical History:  Diagnosis Date   ETD (eustachian tube dysfunction)    GERD (gastroesophageal reflux disease)     Social History   Socioeconomic History   Marital status: Single    Spouse name: Not on file   Number of children: Not on file   Years of education: Not on file   Highest education level: Not on file  Occupational History   Not on file  Tobacco Use   Smoking status: Every Day    Current packs/day: 0.50    Average packs/day: 0.5 packs/day for 26.0 years (13.0 ttl pk-yrs)    Types: Cigarettes   Smokeless tobacco:  Never   Tobacco comments:    Vapes, weed,  Vaping Use   Vaping status: Some Days   Substances: Flavoring  Substance and Sexual Activity   Alcohol use: No   Drug use: Yes    Frequency: 7.0 times per week    Types: Marijuana    Comment: last night used   Sexual activity: Yes    Birth control/protection: Condom  Other Topics Concern   Not on file  Social History Narrative   Not on file   Social Determinants of Health   Financial Resource Strain: Low Risk  (05/07/2023)   Received from [facility]   Overall Financial Resource Strain (CARDIA)    Difficulty of Paying Living Expenses: Not hard at all  Food Insecurity: No Food Insecurity (05/07/2023)   Received from [facility]   Hunger Vital Sign    Worried About Running Out of Food in the Last Year: Never true    Ran Out of Food in the Last Year: Never true  Transportation Needs: No Transportation Needs (05/07/2023)    Received from [facility] - Transportation    In the past 12 months, has lack of transportation kept you from medical appointments or from getting medications?: No    Lack of Transportation (Non-Medical): No  Physical Activity: Not on file  Stress: Not on file  Social Connections: Not on file  Intimate Partner Violence: Not At Risk (11/02/2022)   Humiliation, Afraid, Rape, and Kick questionnaire    Fear of Current or Ex-Partner: No    Emotionally Abused: No    Physically Abused: No    Sexually Abused: No    Past Surgical History:  Procedure Laterality Date   CHOLECYSTECTOMY     TUBAL LIGATION      Family History  Problem Relation Age of Onset   Breast cancer Mother 37       double mastectomy in 48   Asthma Mother    Esophagitis Father    Cancer Maternal Grandfather        prostate    Allergies  Allergen Reactions   Doxycycline Nausea And Vomiting   Beeswax    Carrot [Daucus Carota]    Cats Claw [Uncaria Tomentosa (Cats Claw)]    Erythromycin Swelling       Latest Ref Rng & Units 11/02/2022    3:06 PM 10/29/2022   10:20 AM 10/29/2019    2:22 PM  CBC  WBC 4.0 - 10.5 K/uL 12.7  18.2  18.8   Hemoglobin 12.0 - 15.0 g/dL 67.8  93.8  10.1   Hematocrit 36.0 - 46.0 % 41.6  44.2  41.4   Platelets 150 - 400 K/uL 219  251  244       CMP     Component Value Date/Time   NA 135 11/02/2022 1506   NA 143 06/24/2012 1804   K 3.9 11/02/2022 1506   K 3.7 06/24/2012 1804   CL 100 11/02/2022 1506   CL 109 (H) 06/24/2012 1804   CO2 26 11/02/2022 1506   CO2 26 06/24/2012 1804   GLUCOSE 90 11/02/2022 1506   GLUCOSE 95 06/24/2012 1804   BUN 13 11/02/2022 1506   BUN 5 (L) 06/24/2012 1804   CREATININE 0.87 11/02/2022 1506   CREATININE 0.67 06/24/2012 1804   CALCIUM 9.0 11/02/2022 1506   CALCIUM 9.2 06/24/2012 1804   PROT 7.0 11/02/2022 1506   PROT 7.6 06/24/2012 1804   ALBUMIN 4.0  11/02/2022 1506   ALBUMIN 3.9 06/24/2012 1804   AST 28 11/02/2022  1506   AST 21 06/24/2012 1804   ALT 51 (H) 11/02/2022 1506   ALT 21 06/24/2012 1804   ALKPHOS 75 11/02/2022 1506   ALKPHOS 84 06/24/2012 1804   BILITOT 0.4 11/02/2022 1506   BILITOT 0.5 06/24/2012 1804   GFRNONAA >60 11/02/2022 1506   GFRNONAA >60 06/24/2012 1804     No results found.     Assessment & Plan:   1. Leg pain, bilateral Recommend:  The patient has atypical pain symptoms for vascular disease and on exam I do not find evidence of vascular pathology that would explain the patient's symptoms.  Noninvasive studies do not identify significant vascular problems  I suspect the patient is c/o pseudoclaudication.  She currently is working with the spine center and undergoing physical therapy  The patient should continue walking and begin a more formal exercise program. The patient should continue his antiplatelet therapy and aggressive treatment of the lipid abnormalities.  Patient will follow-up with me on a PRN basis.  2. Lumbar spondylosis Based on noninvasive studies this is the underlying cause of her discomfort  3. Tobacco abuse Smoking cessation was discussed, 3-10 minutes spent on this topic specifically   Current Outpatient Medications on File Prior to Visit  Medication Sig Dispense Refill   acetaminophen (TYLENOL) 325 MG tablet Take 650 mg by mouth every 6 (six) hours as needed.     cyclobenzaprine (FLEXERIL) 5 MG tablet Take 1 tablet (5 mg total) by mouth 3 (three) times daily as needed. 15 tablet 0   ibuprofen (ADVIL) 800 MG tablet Take 1 tablet (800 mg total) by mouth every 8 (eight) hours as needed. 30 tablet 0   ketorolac (TORADOL) 10 MG tablet Take 10 mg by mouth every 6 (six) hours as needed.     No current facility-administered medications on file prior to visit.    There are no Patient Instructions on file for this visit. No follow-ups on file.   Georgiana Spinner, NP

## 2023-05-20 ENCOUNTER — Encounter: Payer: Self-pay | Admitting: Physical Therapy

## 2023-05-20 ENCOUNTER — Ambulatory Visit: Payer: Medicaid Other | Admitting: Physical Therapy

## 2023-05-20 DIAGNOSIS — M5459 Other low back pain: Secondary | ICD-10-CM | POA: Diagnosis present
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
## 2023-05-21 LAB — VAS US ABI WITH/WO TBI
Left ABI: 1.25
Right ABI: 1.25

## 2023-05-25 ENCOUNTER — Encounter: Payer: Medicaid Other | Admitting: Physical Therapy

## 2023-05-27 ENCOUNTER — Encounter: Payer: Medicaid Other | Admitting: Physical Therapy

## 2023-06-08 ENCOUNTER — Ambulatory Visit
Payer: Medicaid Other | Attending: Student in an Organized Health Care Education/Training Program | Admitting: Student in an Organized Health Care Education/Training Program

## 2023-06-08 DIAGNOSIS — M47816 Spondylosis without myelopathy or radiculopathy, lumbar region: Secondary | ICD-10-CM | POA: Diagnosis present

## 2023-06-08 NOTE — Progress Notes (Signed)
Patient has completed PT.  We will try and seek approval for diagnostic lumbar facet medial branch nerve blocks again.  She states that PT unfortunately was not helpful.  Marilyn Hendricks has a history of greater than 3 months of moderate to severe pain which is resulted in functional impairment.  The patient has tried various conservative therapeutic options such as NSAIDs, Tylenol, muscle relaxants, physical therapy which was inadequately effective.  Patient's pain is predominantly axial with physical exam and MRI findings suggestive of facet arthropathy.  Lumbar facet medial branch nerve blocks were discussed with the patient.  Risks and benefits were reviewed.  Patient would like to proceed with bilateral L3, L4, L5 medial branch nerve block.  Orders Placed This Encounter  Procedures   LUMBAR FACET(MEDIAL BRANCH NERVE BLOCK) MBNB    Standing Status:   Future    Standing Expiration Date:   09/07/2023    Scheduling Instructions:     Procedure: Lumbar facet block (AKA.: Lumbosacral medial branch nerve block)     Side: Bilateral     Level: L3-4, L4-5,  Facets (L3, L4, L5, Medial Branch)     Sedation: Patient's choice.     Timeframe: ASAA    Order Specific Question:   Where will this procedure be performed?    Answer:   [facility]

## 2023-06-30 ENCOUNTER — Ambulatory Visit
Payer: Medicaid Other | Attending: Student in an Organized Health Care Education/Training Program | Admitting: Student in an Organized Health Care Education/Training Program

## 2023-06-30 ENCOUNTER — Encounter: Payer: Self-pay | Admitting: Student in an Organized Health Care Education/Training Program

## 2023-06-30 ENCOUNTER — Ambulatory Visit
Admission: RE | Admit: 2023-06-30 | Discharge: 2023-06-30 | Disposition: A | Discharge status: Home / Self Care | Payer: Medicaid Other | Source: Ambulatory Visit | Attending: Student in an Organized Health Care Education/Training Program | Admitting: Student in an Organized Health Care Education/Training Program

## 2023-06-30 DIAGNOSIS — M47816 Spondylosis without myelopathy or radiculopathy, lumbar region: Secondary | ICD-10-CM | POA: Diagnosis not present

## 2023-06-30 MED ORDER — LIDOCAINE HCL 2 % IJ SOLN
INTRAMUSCULAR | Status: AC
  Filled 2023-06-30: qty 20

## 2023-06-30 MED ORDER — ROPIVACAINE HCL 2 MG/ML IJ SOLN
18.0000 mL | Freq: Once | INTRAMUSCULAR | Status: AC
  Administered 2023-06-30: 18 mL via PERINEURAL

## 2023-06-30 MED ORDER — DEXAMETHASONE SODIUM PHOSPHATE 10 MG/ML IJ SOLN
INTRAMUSCULAR | Status: AC
  Filled 2023-06-30: qty 2

## 2023-06-30 MED ORDER — LIDOCAINE HCL 2 % IJ SOLN
20.0000 mL | Freq: Once | INTRAMUSCULAR | Status: AC
  Administered 2023-06-30: 400 mg

## 2023-06-30 MED ORDER — MIDAZOLAM HCL 2 MG/2ML IJ SOLN
INTRAMUSCULAR | Status: AC
  Filled 2023-06-30: qty 2

## 2023-06-30 MED ORDER — DEXAMETHASONE SODIUM PHOSPHATE 10 MG/ML IJ SOLN
20.0000 mg | Freq: Once | INTRAMUSCULAR | Status: AC
  Administered 2023-06-30: 20 mg

## 2023-06-30 MED ORDER — ROPIVACAINE HCL 2 MG/ML IJ SOLN
INTRAMUSCULAR | Status: AC
  Filled 2023-06-30: qty 20

## 2023-06-30 MED ORDER — MIDAZOLAM HCL 2 MG/2ML IJ SOLN
0.5000 mg | Freq: Once | INTRAMUSCULAR | Status: AC
  Administered 2023-06-30: 2 mg via INTRAVENOUS

## 2023-06-30 MED ORDER — LACTATED RINGERS IV SOLN: Freq: Once | INTRAVENOUS | Status: AC

## 2023-06-30 NOTE — Progress Notes (Signed)
PROVIDER NOTE: Interpretation of information contained herein should be left to medically-trained personnel. Specific patient instructions are provided elsewhere under "Patient Instructions" section of medical record. This document was created in part using STT-dictation technology, any transcriptional errors that may result from this process are unintentional.  Patient: Marilyn Hendricks Type: Established DOB: August 15, 1976 MRN: [id number] PCP: Alm Bustard, NP  Service: Procedure DOS: 06/30/2023 Setting: Ambulatory Location: Ambulatory outpatient facility Delivery: Face-to-face Provider: Edward Jolly, MD Specialty: Interventional Pain Management Specialty designation: 09 Location: Outpatient facility Ref. Prov.: Edward Jolly, MD       Interventional Therapy   Procedure: Lumbar Facet, Medial Branch Block(s) #1  Laterality: Bilateral  Level: L3, L4, and L5 Medial Branch Level(s). Injecting these levels blocks the L3-4 and L4-5 lumbar facet joints. Imaging: Fluoroscopic guidance         Anesthesia: Local anesthesia (1-2% Lidocaine) Sedation: Minimal Sedation                       DOS: 06/30/2023 Performed by: Edward Jolly, MD  Primary Purpose: Diagnostic/Therapeutic Indications: Low back pain severe enough to impact quality of life or function. 1. Lumbar facet arthropathy   2. Lumbar spondylosis    NAS-11 Pain score:   Pre-procedure: 7 /10   Post-procedure: 0-No pain/10     Position / Prep / Materials:  Position: Prone  Prep solution: DuraPrep (Iodine Povacrylex [0.7% available iodine] and Isopropyl Alcohol, 74% w/w) Area Prepped: Posterolateral Lumbosacral Spine (Wide prep: From the lower border of the scapula down to the end of the tailbone and from flank to flank.)  Materials:  Tray: Block Needle(s):  Type: Spinal  Gauge (G): 22  Length: 3.5-in Qty: 2      H&P (Pre-op Assessment):  Marilyn Hendricks is a 47 y.o. (year old), female patient, seen today for interventional  treatment. She  has a past surgical history that includes Tubal ligation and Cholecystectomy. Marilyn Hendricks has a current medication list which includes the following prescription(s): acetaminophen, cyanocobalamin, ibuprofen, ketorolac, wegovy, and cyclobenzaprine. Her primarily concern today is the Back Pain and Neck Pain  Initial Vital Signs:  Pulse/HCG Rate: (!) 118ECG Heart Rate: 83 Temp: (!) 97.2 F (36.2 C) Resp: 17 BP: (!) 106/92 SpO2: 98 %  BMI: Estimated body mass index is 31.47 kg/m as calculated from the following:   Height as of this encounter: 5' 1.5" (1.562 m).   Weight as of this encounter: 169 lb 4.8 oz (76.8 kg).  Risk Assessment: Allergies: Reviewed. She is allergic to doxycycline, beeswax, carrot [daucus carota], cats claw [uncaria tomentosa (cats claw)], and erythromycin.  Allergy Precautions: None required Coagulopathies: Reviewed. None identified.  Blood-thinner therapy: None at this time Active Infection(s): Reviewed. None identified. Marilyn Hendricks is afebrile  Site Confirmation: Marilyn Hendricks was asked to confirm the procedure and laterality before marking the site Procedure checklist: Completed Consent: Before the procedure and under the influence of no sedative(s), amnesic(s), or anxiolytics, the patient was informed of the treatment options, risks and possible complications. To fulfill our ethical and legal obligations, as recommended by the American Medical Association's Code of Ethics, I have informed the patient of my clinical impression; the nature and purpose of the treatment or procedure; the risks, benefits, and possible complications of the intervention; the alternatives, including doing nothing; the risk(s) and benefit(s) of the alternative treatment(s) or procedure(s); and the risk(s) and benefit(s) of doing nothing. The patient was provided information about the general risks and possible complications associated with  the procedure. These may include, but are not  limited to: failure to achieve desired goals, infection, bleeding, organ or nerve damage, allergic reactions, paralysis, and death. In addition, the patient was informed of those risks and complications associated to Spine-related procedures, such as failure to decrease pain; infection (i.e.: Meningitis, epidural or intraspinal abscess); bleeding (i.e.: epidural hematoma, subarachnoid hemorrhage, or any other type of intraspinal or peri-dural bleeding); organ or nerve damage (i.e.: Any type of peripheral nerve, nerve root, or spinal cord injury) with subsequent damage to sensory, motor, and/or autonomic systems, resulting in permanent pain, numbness, and/or weakness of one or several areas of the body; allergic reactions; (i.e.: anaphylactic reaction); and/or death. Furthermore, the patient was informed of those risks and complications associated with the medications. These include, but are not limited to: allergic reactions (i.e.: anaphylactic or anaphylactoid reaction(s)); adrenal axis suppression; blood sugar elevation that in diabetics may result in ketoacidosis or comma; water retention that in patients with history of congestive heart failure may result in shortness of breath, pulmonary edema, and decompensation with resultant heart failure; weight gain; swelling or edema; medication-induced neural toxicity; particulate matter embolism and blood vessel occlusion with resultant organ, and/or nervous system infarction; and/or aseptic necrosis of one or more joints. Finally, the patient was informed that Medicine is not an exact science; therefore, there is also the possibility of unforeseen or unpredictable risks and/or possible complications that may result in a catastrophic outcome. The patient indicated having understood very clearly. We have given the patient no guarantees and we have made no promises. Enough time was given to the patient to ask questions, all of which were answered to the patient's  satisfaction. Marilyn Hendricks has indicated that she wanted to continue with the procedure. Attestation: I, the ordering provider, attest that I have discussed with the patient the benefits, risks, side-effects, alternatives, likelihood of achieving goals, and potential problems during recovery for the procedure that I have provided informed consent. Date  Time: 06/30/2023 10:23 AM   Pre-Procedure Preparation:  Monitoring: As per clinic protocol. Respiration, ETCO2, SpO2, BP, heart rate and rhythm monitor placed and checked for adequate function Safety Precautions: Patient was assessed for positional comfort and pressure points before starting the procedure. Time-out: I initiated and conducted the "Time-out" before starting the procedure, as per protocol. The patient was asked to participate by confirming the accuracy of the "Time Out" information. Verification of the correct person, site, and procedure were performed and confirmed by me, the nursing staff, and the patient. "Time-out" conducted as per Joint Commission's Universal Protocol (UP.01.01.01). Time: 1105 Start Time: 1105 hrs.  Description of Procedure:          Laterality: (see above) Targeted Levels: (see above)  Safety Precautions: Aspiration looking for blood return was conducted prior to all injections. At no point did we inject any substances, as a needle was being advanced. Before injecting, the patient was told to immediately notify me if she was experiencing any new onset of "ringing in the ears, or metallic taste in the mouth". No attempts were made at seeking any paresthesias. Safe injection practices and needle disposal techniques used. Medications properly checked for expiration dates. SDV (single dose vial) medications used. After the completion of the procedure, all disposable equipment used was discarded in the proper designated medical waste containers. Local Anesthesia: Protocol guidelines were followed. The patient was  positioned over the fluoroscopy table. The area was prepped in the usual manner. The time-out was completed. The target area was  identified using fluoroscopy. A 12-in long, straight, sterile hemostat was used with fluoroscopic guidance to locate the targets for each level blocked. Once located, the skin was marked with an approved surgical skin marker. Once all sites were marked, the skin (epidermis, dermis, and hypodermis), as well as deeper tissues (fat, connective tissue and muscle) were infiltrated with a small amount of a short-acting local anesthetic, loaded on a 10cc syringe with a 25G, 1.5-in  Needle. An appropriate amount of time was allowed for local anesthetics to take effect before proceeding to the next step. Local Anesthetic: Lidocaine 2.0% The unused portion of the local anesthetic was discarded in the proper designated containers. Technical description of process:   L3 Medial Branch Nerve Block (MBB): The target area for the L3 medial branch is at the junction of the postero-lateral aspect of the superior articular process and the superior, posterior, and medial edge of the transverse process of L4. Under fluoroscopic guidance, a Quincke needle was inserted until contact was made with os over the superior postero-lateral aspect of the pedicular shadow (target area). After negative aspiration for blood, 2mL of the nerve block solution was injected without difficulty or complication. The needle was removed intact. L4 Medial Branch Nerve Block (MBB): The target area for the L4 medial branch is at the junction of the postero-lateral aspect of the superior articular process and the superior, posterior, and medial edge of the transverse process of L5. Under fluoroscopic guidance, a Quincke needle was inserted until contact was made with os over the superior postero-lateral aspect of the pedicular shadow (target area). After negative aspiration for blood, 2mL of the nerve block solution was injected  without difficulty or complication. The needle was removed intact. L5 Medial Branch Nerve Block (MBB): The target area for the L5 medial branch is at the junction of the postero-lateral aspect of the superior articular process and the superior, posterior, and medial edge of the sacral ala. Under fluoroscopic guidance, a Quincke needle was inserted until contact was made with os over the superior postero-lateral aspect of the pedicular shadow (target area). After negative aspiration for blood, 2mL of the nerve block solution was injected without difficulty or complication. The needle was removed intact.   Once the entire procedure was completed, the treated area was cleaned, making sure to leave some of the prepping solution back to take advantage of its long term bactericidal properties.         Illustration of the posterior view of the lumbar spine and the posterior neural structures. Laminae of L2 through S1 are labeled. DPRL5, dorsal primary ramus of L5; DPRS1, dorsal primary ramus of S1; DPR3, dorsal primary ramus of L3; FJ, facet (zygapophyseal) joint L3-L4; I, inferior articular process of L4; LB1, lateral branch of dorsal primary ramus of L1; IAB, inferior articular branches from L3 medial branch (supplies L4-L5 facet joint); IBP, intermediate branch plexus; MB3, medial branch of dorsal primary ramus of L3; NR3, third lumbar nerve root; S, superior articular process of L5; SAB, superior articular branches from L4 (supplies L4-5 facet joint also); TP3, transverse process of L3.   Facet Joint Innervation (* possible contribution)  L1-2 T12, L1 (L2*)  Medial Branch  L2-3 L1, L2 (L3*)         "          "  L3-4 L2, L3 (L4*)         "          "  L4-5 L3, L4 (L5*)         "          "  L5-S1 L4, L5, S1          "          "    Vitals:   06/30/23 1057 06/30/23 1102 06/30/23 1107 06/30/23 1116  BP: (!) 134/100 111/89 113/89 104/84  Pulse:      Resp: 18 17 16 16   Temp:      SpO2: 96% 95%  95% 95%  Weight:      Height:         End Time: 1112 hrs.  Imaging Guidance (Spinal):          Type of Imaging Technique: Fluoroscopy Guidance (Spinal) Indication(s): Assistance in needle guidance and placement for procedures requiring needle placement in or near specific anatomical locations not easily accessible without such assistance. Exposure Time: Please see nurses notes. Contrast: None used. Fluoroscopic Guidance: I was personally present during the use of fluoroscopy. "Tunnel Vision Technique" used to obtain the best possible view of the target area. Parallax error corrected before commencing the procedure. "Direction-depth-direction" technique used to introduce the needle under continuous pulsed fluoroscopy. Once target was reached, antero-posterior, oblique, and lateral fluoroscopic projection used confirm needle placement in all planes. Images permanently stored in EMR. Interpretation: No contrast injected. I personally interpreted the imaging intraoperatively. Adequate needle placement confirmed in multiple planes. Permanent images saved into the patient's record.  Post-operative Assessment:  Post-procedure Vital Signs:  Pulse/HCG Rate: 8473 Temp: (!) 97.2 F (36.2 C) Resp: 16 BP: 104/84 SpO2: 95 %  EBL: None  Complications: No immediate post-treatment complications observed by team, or reported by patient.  Note: The patient tolerated the entire procedure well. A repeat set of vitals were taken after the procedure and the patient was kept under observation following institutional policy, for this type of procedure. Post-procedural neurological assessment was performed, showing return to baseline, prior to discharge. The patient was provided with post-procedure discharge instructions, including a section on how to identify potential problems. Should any problems arise concerning this procedure, the patient was given instructions to immediately contact us, at any time, without  hesitation. In any case, we plan to contact the patient by telephone for a follow-up status report regarding this interventional procedure.  Comments:  No additional relevant information.  Plan of Care (POC)  Orders:  Orders Placed This Encounter  Procedures   DG PAIN [facility] C-ARM 1-60 MIN NO REPORT    Intraoperative interpretation by procedural physician at [facility].    Standing Status:   Standing    Number of Occurrences:   1    Order Specific Question:   Reason for exam:    Answer:   Assistance in needle guidance and placement for procedures requiring needle placement in or near specific anatomical locations not easily accessible without such assistance.    Medications ordered for procedure: Meds ordered this encounter  Medications   lidocaine (XYLOCAINE) 2 % (with pres) injection 400 mg   lactated ringers infusion   midazolam (VERSED) injection 0.5-2 mg    Make sure Flumazenil is available in the pyxis when using this medication. If oversedation occurs, administer 0.2 mg IV over 15 sec. If after 45 sec no response, administer 0.2 mg again over 1 min; may repeat at 1 min intervals; not to exceed 4 doses (1 mg)   dexamethasone (DECADRON) injection 20 mg   ropivacaine (PF) 2 mg/mL (0.2%) (NAROPIN) injection 18 mL   Medications administered: We administered lidocaine, lactated ringers, midazolam, dexamethasone, and ropivacaine (PF) 2 mg/mL (0.2%).  See the medical record for exact dosing, route, and time of administration.  Follow-up plan:   Return in about 4 weeks (around 07/28/2023) for F2F PPE.      Recent Visits Date Type Provider Dept  06/08/23 Office Visit Edward Jolly, MD [facility]  Showing recent visits within past 90 days and meeting all other requirements Today's Visits Date Type Provider Dept  06/30/23 Procedure visit Edward Jolly, MD [facility]  Showing today's visits and meeting all other requirements Future  Appointments Date Type Provider Dept  08/04/23 Appointment Edward Jolly, MD [facility]  Showing future appointments within next 90 days and meeting all other requirements  Disposition: Discharge home  Discharge (Date  Time): 06/30/2023; 1125 hrs.   Primary Care Physician: Alm Bustard, NP Location: [facility] Note by: Edward Jolly, MD (TTS technology used. I apologize for any typographical errors that were not detected and corrected.) Date: 06/30/2023; Time: 12:00 PM  Disclaimer:  Medicine is not an Visual merchandiser. The only guarantee in medicine is that nothing is guaranteed. It is important to note that the decision to proceed with this intervention was based on the information collected from the patient. The Data and conclusions were drawn from the patient's questionnaire, the interview, and the physical examination. Because the information was provided in large part by the patient, it cannot be guaranteed that it has not been purposely or unconsciously manipulated. Every effort has been made to obtain as much relevant data as possible for this evaluation. It is important to note that the conclusions that lead to this procedure are derived in large part from the available data. Always take into account that the treatment will also be dependent on availability of resources and existing treatment guidelines, considered by other Pain Management Practitioners as being common knowledge and practice, at the time of the intervention. For Medico-Legal purposes, it is also important to point out that variation in procedural techniques and pharmacological choices are the acceptable norm. The indications, contraindications, technique, and results of the above procedure should only be interpreted and judged by a Board-Certified Interventional Pain Specialist with extensive familiarity and expertise in the same exact procedure and technique.

## 2023-06-30 NOTE — Progress Notes (Signed)
Safety precautions to be maintained throughout the outpatient stay will include: orient to surroundings, keep bed in low position, maintain call bell within reach at all times, provide assistance with transfer out of bed and ambulation.  

## 2023-06-30 NOTE — Patient Instructions (Signed)

## 2023-07-01 ENCOUNTER — Telehealth: Payer: Self-pay | Admitting: *Deleted

## 2023-07-01 NOTE — Telephone Encounter (Signed)
No problems post procedure. 

## 2023-08-04 ENCOUNTER — Ambulatory Visit
Payer: Medicaid Other | Attending: Student in an Organized Health Care Education/Training Program | Admitting: Student in an Organized Health Care Education/Training Program

## 2023-08-04 ENCOUNTER — Encounter: Payer: Self-pay | Admitting: Student in an Organized Health Care Education/Training Program

## 2023-08-04 VITALS — BP 110/95 | HR 86 | Temp 97.9°F | Resp 16 | Ht 61.5 in | Wt 163.0 lb

## 2023-08-04 DIAGNOSIS — G894 Chronic pain syndrome: Secondary | ICD-10-CM

## 2023-08-04 DIAGNOSIS — M47816 Spondylosis without myelopathy or radiculopathy, lumbar region: Secondary | ICD-10-CM

## 2023-08-04 NOTE — Progress Notes (Signed)
Safety precautions to be maintained throughout the outpatient stay will include: orient to surroundings, keep bed in low position, maintain call bell within reach at all times, provide assistance with transfer out of bed and ambulation.  

## 2023-08-04 NOTE — Patient Instructions (Signed)
Procedure instructions  Do not eat or drink fluids (other than water) for 6 hours before your procedure  No water for 2 hours before your procedure  Take your blood pressure medicine with a sip of water  Arrive 30 minutes before your appointment  Carefully read the "Preparing for your procedure" detailed instructions  If you have questions call us at [phone]  _____________________________________________________________________    ______________________________________________________________________  Preparing for your procedure  Appointments: If you think you may not be able to keep your appointment, call 24-48 hours in advance to cancel. We need time to make it available to others.  During your procedure appointment there will be: No Prescription Refills. No disability issues to discussed. No medication changes or discussions.  Instructions: Food intake: Avoid eating anything solid for at least 8 hours prior to your procedure. Clear liquid intake: You may take clear liquids such as water up to 2 hours prior to your procedure. (No carbonated drinks. No soda.) Transportation: Unless otherwise stated by your physician, bring a driver. Morning Medicines: Except for blood thinners, take all of your other morning medications with a sip of water. Make sure to take your heart and blood pressure medicines. If your blood pressure's lower number is above 100, the case will be rescheduled. Blood thinners: Make sure to stop your blood thinners as instructed.  If you take a blood thinner, but were not instructed to stop it, call our office [phone] and ask to talk to a nurse. Not stopping a blood thinner prior to certain procedures could lead to serious complications. Diabetics on insulin: Notify the staff so that you can be scheduled 1st case in the morning. If your diabetes requires high dose insulin, take only  of your normal insulin dose the morning of the procedure and  notify the staff that you have done so. Preventing infections: Shower with an antibacterial soap the morning of your procedure.  Build-up your immune system: Take 1000 mg of Vitamin C with every meal (3 times a day) the day prior to your procedure. Antibiotics: Inform the nursing staff if you are taking any antibiotics or if you have any conditions that may require antibiotics prior to procedures. (Example: recent joint implants)   Pregnancy: If you are pregnant make sure to notify the nursing staff. Not doing so may result in injury to the fetus, including death.  Sickness: If you have a cold, fever, or any active infections, call and cancel or reschedule your procedure. Receiving steroids while having an infection may result in complications. Arrival: You must be in the facility at least 30 minutes prior to your scheduled procedure. Tardiness: Your scheduled time is also the cutoff time. If you do not arrive at least 15 minutes prior to your procedure, you will be rescheduled.  Children: Do not bring any children with you. Make arrangements to keep them home. Dress appropriately: There is always a possibility that your clothing may get soiled. Avoid long dresses. Valuables: Do not bring any jewelry or valuables.  Reasons to call and reschedule or cancel your procedure: (Following these recommendations will minimize the risk of a serious complication.) Surgeries: Avoid having procedures within 2 weeks of any surgery. (Avoid for 2 weeks before or after any surgery). Flu Shots: Avoid having procedures within 2 weeks of a flu shots or . (Avoid for 2 weeks before or after immunizations). Barium: Avoid having a procedure within 7-10 days after having had a radiological study involving the use of radiological contrast. (  Myelograms, Barium swallow or enema study). Heart attacks: Avoid any elective procedures or surgeries for the initial 6 months after a "Myocardial Infarction" (Heart Attack). Blood  thinners: It is imperative that you stop these medications before procedures. Let us know if you if you take any blood thinner.  Infection: Avoid procedures during or within two weeks of an infection (including chest colds or gastrointestinal problems). Symptoms associated with infections include: Localized redness, fever, chills, night sweats or profuse sweating, burning sensation when voiding, cough, congestion, stuffiness, runny nose, sore throat, diarrhea, nausea, vomiting, cold or Flu symptoms, recent or current infections. It is specially important if the infection is over the area that we intend to treat. Heart and lung problems: Symptoms that may suggest an active cardiopulmonary problem include: cough, chest pain, breathing difficulties or shortness of breath, dizziness, ankle swelling, uncontrolled high or unusually low blood pressure, and/or palpitations. If you are experiencing any of these symptoms, cancel your procedure and contact your primary care physician for an evaluation.  Remember:  Regular Business hours are:  Monday to Thursday 8:00 AM to 4:00 PM  Provider's Schedule: Francisco Naveira, MD:  Procedure days: Tuesday and Thursday 7:30 AM to 4:00 PM  Bilal Lateef, MD:  Procedure days: Monday and Wednesday 7:30 AM to 4:00 PM 

## 2023-08-04 NOTE — Progress Notes (Signed)
PROVIDER NOTE: Information contained herein reflects review and annotations entered in association with encounter. Interpretation of such information and data should be left to medically-trained personnel. Information provided to patient can be located elsewhere in the medical record under "Patient Instructions". Document created using STT-dictation technology, any transcriptional errors that may result from process are unintentional.    Patient: Marilyn Hendricks  Service Category: E/M  Provider: Edward Jolly, MD  DOB: 06-11-1976  DOS: 08/04/2023  Referring Provider: Alm Bustard, NP  MRN: [id number]  Specialty: Interventional Pain Management  PCP: Alm Bustard, NP  Type: Established Patient  Setting: Ambulatory outpatient    Location: Office  Delivery: Face-to-face     HPI  Marilyn Hendricks, a 47 y.o. year old female, is here today because of her Lumbar facet arthropathy [M47.816]. Marilyn Hendricks primary complain today is Back Pain and Neck Pain  Pertinent problems: Marilyn Hendricks has Lumbar spondylosis; Cervical facet joint syndrome; Cervical radicular pain; and Chronic pain syndrome on their pertinent problem list. Pain Assessment: Severity of Chronic pain is reported as a 10-Worst pain ever/10. Location: Neck Right, Posterior, Left/radaites to mid upper arms bilateral and causing numbness in fingers. Pain also goes from back of neck uptowards the head causing headaches. Onset: More than a month ago. Quality: Aching, Constant, Dull, Throbbing, Headache, Shooting. Timing: Constant. Modifying factor(s): Toradol as needed. Vitals:  height is 5' 1.5" (1.562 m) and weight is 163 lb (73.9 kg). Her temporal temperature is 97.9 F (36.6 C). Her blood pressure is 110/95 (abnormal) and her pulse is 86. Her respiration is 16 and oxygen saturation is 99%.  BMI: Estimated body mass index is 30.3 kg/m as calculated from the following:   Height as of this encounter: 5' 1.5" (1.562 m).   Weight as of this  encounter: 163 lb (73.9 kg). Last encounter: 06/08/2023. Last procedure: 06/30/2023.  Reason for encounter: post-procedure evaluation and assessment.    Post-procedure evaluation   Procedure: Lumbar Facet, Medial Branch Block(s) #1  Laterality: Bilateral  Level: L3, L4, and L5 Medial Branch Level(s). Injecting these levels blocks the L3-4 and L4-5 lumbar facet joints. Imaging: Fluoroscopic guidance         Anesthesia: Local anesthesia (1-2% Lidocaine) Sedation: Minimal Sedation                       DOS: 06/30/2023 Performed by: Edward Jolly, MD  Primary Purpose: Diagnostic/Therapeutic Indications: Low back pain severe enough to impact quality of life or function. 1. Lumbar facet arthropathy   2. Lumbar spondylosis    NAS-11 Pain score:   Pre-procedure: 7 /10   Post-procedure: 0-No pain/10      Effectiveness:  Initial hour after procedure: 100 %  Subsequent 4-6 hours post-procedure: 100 %  Analgesia past initial 6 hours: 100 %  Ongoing improvement:  Analgesic:  85% Function: Marilyn Hendricks reports improvement in function ROM: Marilyn Hendricks reports improvement in ROM   ROS  Constitutional: Denies any fever or chills Gastrointestinal: No reported hemesis, hematochezia, vomiting, or acute GI distress Musculoskeletal:  Improvement in axial low back pain after diagnostic lumbar facets Neurological: No reported episodes of acute onset apraxia, aphasia, dysarthria, agnosia, amnesia, paralysis, loss of coordination, or loss of consciousness  Medication Review  Semaglutide-Weight Management, acetaminophen, cyanocobalamin, ibuprofen, and ketorolac  History Review  Allergy: Marilyn Hendricks is allergic to doxycycline, beeswax, carrot [daucus carota], cats claw [uncaria tomentosa (cats claw)], and erythromycin. Drug: Marilyn Hendricks  reports current drug use.  Frequency: 7.00 times per week. Drug: Marijuana. Alcohol:  reports no history of alcohol use. Tobacco:  reports that she has been smoking  cigarettes. She has a 13 pack-year smoking history. She has never used smokeless tobacco. Social: Marilyn Hendricks  reports that she has been smoking cigarettes. She has a 13 pack-year smoking history. She has never used smokeless tobacco. She reports current drug use. Frequency: 7.00 times per week. Drug: Marijuana. She reports that she does not drink alcohol. Medical:  has a past medical history of ETD (eustachian tube dysfunction) and GERD (gastroesophageal reflux disease). Surgical: Marilyn Hendricks  has a past surgical history that includes Tubal ligation and Cholecystectomy. Family: family history includes Asthma in her mother; Breast cancer (age of onset: 50) in her mother; Cancer in her maternal grandfather; Esophagitis in her father.  Laboratory Chemistry Profile   Renal Lab Results  Component Value Date   BUN 13 11/02/2022   CREATININE 0.87 11/02/2022   GFRAA >60 10/29/2019   GFRNONAA >60 11/02/2022    Hepatic Lab Results  Component Value Date   AST 28 11/02/2022   ALT 51 (H) 11/02/2022   ALBUMIN 4.0 11/02/2022   ALKPHOS 75 11/02/2022   LIPASE 18 10/29/2019    Electrolytes Lab Results  Component Value Date   NA 135 11/02/2022   K 3.9 11/02/2022   CL 100 11/02/2022   CALCIUM 9.0 11/02/2022    Bone No results found for: "VD25OH", "VD125OH2TOT", "WU9811BJ4", "NW2956OZ3", "25OHVITD1", "25OHVITD2", "25OHVITD3", "TESTOFREE", "TESTOSTERONE"  Inflammation (CRP: Acute Phase) (ESR: Chronic Phase) Lab Results  Component Value Date   CRP 1.8 (H) 11/02/2022         Note: Above Lab results reviewed.    Physical Exam  General appearance: Well nourished, well developed, and well hydrated. In no apparent acute distress Mental status: Alert, oriented x 3 (person, place, & time)       Respiratory: No evidence of acute respiratory distress Eyes: PERLA Vitals: BP (!) 110/95   Pulse 86   Temp 97.9 F (36.6 C) (Temporal)   Resp 16   Ht 5' 1.5" (1.562 m)   Wt 163 lb (73.9 kg)   SpO2 99%    BMI 30.30 kg/m  BMI: Estimated body mass index is 30.3 kg/m as calculated from the following:   Height as of this encounter: 5' 1.5" (1.562 m).   Weight as of this encounter: 163 lb (73.9 kg). Ideal: Ideal body weight: 48.9 kg (107 lb 14.6 oz) Adjusted ideal body weight: 58.9 kg (129 lb 15.2 oz)  Positive low back pain related to lumbar facet loading and lumbar extension   Assessment   Diagnosis Status  1. Lumbar facet arthropathy   2. Lumbar spondylosis   3. Chronic pain syndrome    Responding Responding Controlled   Updated Problems: No problems updated.  Plan of Care   Positive response to first set of diagnostic lumbar facet medial branch nerve blocks as detailed above.  Recommend repeating diagnostic set #2 and then consider lumbar radiofrequency ablation for the purpose of obtaining longer-term pain relief.  Orders:  Orders Placed This Encounter  Procedures   LUMBAR FACET(MEDIAL BRANCH NERVE BLOCK) MBNB    Standing Status:   Future    Standing Expiration Date:   11/04/2023    Scheduling Instructions:     Procedure: Lumbar facet block (AKA.: Lumbosacral medial branch nerve block)     Side: Bilateral     Level: L3-4, L4-5,Facets (L3, L4, L5, Medial Branch)  Sedation: IV Versed     Timeframe: ASAA    Order Specific Question:   Where will this procedure be performed?    Answer:   [facility]   Follow-up plan:   Return in about 12 days (around 08/16/2023) for B/L L3, 4, 5 MBNB #2 , in clinic IV Versed.      Recent Visits Date Type Provider Dept  06/30/23 Procedure visit Edward Jolly, MD [facility]  06/08/23 Office Visit Edward Jolly, MD [facility]  Showing recent visits within past 90 days and meeting all other requirements Today's Visits Date Type Provider Dept  08/04/23 Office Visit Edward Jolly, MD [facility]  Showing today's visits and meeting all other requirements Future Appointments No visits were  found meeting these conditions. Showing future appointments within next 90 days and meeting all other requirements  I discussed the assessment and treatment plan with the patient. The patient was provided an opportunity to ask questions and all were answered. The patient agreed with the plan and demonstrated an understanding of the instructions.  Patient advised to call back or seek an in-person evaluation if the symptoms or condition worsens.  Duration of encounter: .  Total time on encounter, as per AMA guidelines included both the face-to-face and non-face-to-face time personally spent by the physician and/or other qualified health care professional(s) on the day of the encounter (includes time in activities that require the physician or other qualified health care professional and does not include time in activities normally performed by clinical staff). Physician's time may include the following activities when performed: Preparing to see the patient (e.g., pre-charting review of records, searching for previously ordered imaging, lab work, and nerve conduction tests) Review of prior analgesic pharmacotherapies. Reviewing PMP Interpreting ordered tests (e.g., lab work, imaging, nerve conduction tests) Performing post-procedure evaluations, including interpretation of diagnostic procedures Obtaining and/or reviewing separately obtained history Performing a medically appropriate examination and/or evaluation Counseling and educating the patient/family/caregiver Ordering medications, tests, or procedures Referring and communicating with other health care professionals (when not separately reported) Documenting clinical information in the electronic or other health record Independently interpreting results (not separately reported) and communicating results to the patient/ family/caregiver Care coordination (not separately reported)  Note by: Edward Jolly, MD Date: 08/04/2023; Time:  2:59 PM

## 2023-08-18 ENCOUNTER — Ambulatory Visit
Admission: RE | Admit: 2023-08-18 | Discharge: 2023-08-18 | Disposition: A | Discharge status: Home / Self Care | Payer: Medicaid Other | Source: Ambulatory Visit | Attending: Student in an Organized Health Care Education/Training Program | Admitting: Student in an Organized Health Care Education/Training Program

## 2023-08-18 ENCOUNTER — Encounter: Payer: Self-pay | Admitting: Student in an Organized Health Care Education/Training Program

## 2023-08-18 ENCOUNTER — Ambulatory Visit
Payer: Medicaid Other | Attending: Student in an Organized Health Care Education/Training Program | Admitting: Student in an Organized Health Care Education/Training Program

## 2023-08-18 VITALS — BP 105/79 | HR 80 | Temp 98.1°F | Resp 16 | Ht 61.0 in | Wt 163.0 lb

## 2023-08-18 DIAGNOSIS — G894 Chronic pain syndrome: Secondary | ICD-10-CM | POA: Insufficient documentation

## 2023-08-18 DIAGNOSIS — M47816 Spondylosis without myelopathy or radiculopathy, lumbar region: Secondary | ICD-10-CM | POA: Insufficient documentation

## 2023-08-18 MED ORDER — LACTATED RINGERS IV SOLN: Freq: Once | INTRAVENOUS | Status: AC

## 2023-08-18 MED ORDER — LIDOCAINE HCL 2 % IJ SOLN
INTRAMUSCULAR | Status: AC
Start: 2023-08-18 — End: ?
  Filled 2023-08-18: qty 20

## 2023-08-18 MED ORDER — DEXAMETHASONE SODIUM PHOSPHATE 10 MG/ML IJ SOLN
INTRAMUSCULAR | Status: AC
Start: 2023-08-18 — End: ?
  Filled 2023-08-18: qty 2

## 2023-08-18 MED ORDER — DEXAMETHASONE SODIUM PHOSPHATE 10 MG/ML IJ SOLN
20.0000 mg | Freq: Once | INTRAMUSCULAR | Status: AC
  Administered 2023-08-18: 20 mg

## 2023-08-18 MED ORDER — MIDAZOLAM HCL 2 MG/2ML IJ SOLN
INTRAMUSCULAR | Status: AC
  Filled 2023-08-18: qty 2

## 2023-08-18 MED ORDER — LIDOCAINE HCL 2 % IJ SOLN
20.0000 mL | Freq: Once | INTRAMUSCULAR | Status: AC
  Administered 2023-08-18: 400 mg

## 2023-08-18 MED ORDER — MIDAZOLAM HCL 2 MG/2ML IJ SOLN
0.5000 mg | Freq: Once | INTRAMUSCULAR | Status: AC
  Administered 2023-08-18: 2 mg via INTRAVENOUS

## 2023-08-18 MED ORDER — ROPIVACAINE HCL 2 MG/ML IJ SOLN
18.0000 mL | Freq: Once | INTRAMUSCULAR | Status: AC
  Administered 2023-08-18: 18 mL via PERINEURAL

## 2023-08-18 MED ORDER — ROPIVACAINE HCL 2 MG/ML IJ SOLN
INTRAMUSCULAR | Status: AC
Start: 2023-08-18 — End: ?
  Filled 2023-08-18: qty 20

## 2023-08-18 NOTE — Progress Notes (Signed)
PROVIDER NOTE: Interpretation of information contained herein should be left to medically-trained personnel. Specific patient instructions are provided elsewhere under "Patient Instructions" section of medical record. This document was created in part using STT-dictation technology, any transcriptional errors that may result from this process are unintentional.  Patient: Marilyn Hendricks Type: Established DOB: Jan 07, 1976 MRN: [id number] PCP: Alm Bustard, NP  Service: Procedure DOS: 08/18/2023 Setting: Ambulatory Location: Ambulatory outpatient facility Delivery: Face-to-face Provider: Edward Jolly, MD Specialty: Interventional Pain Management Specialty designation: 09 Location: Outpatient facility Ref. Prov.: Fields, Lisabeth Pick, NP       Interventional Therapy   Procedure: Lumbar Facet, Medial Branch Block(s) #2  Laterality: Bilateral  Level: L3, L4, and L5 Medial Branch Level(s). Injecting these levels blocks the L3-4 and L4-5 lumbar facet joints. Imaging: Fluoroscopic guidance         Anesthesia: Local anesthesia (1-2% Lidocaine) Sedation: Minimal Sedation                       DOS: 08/18/2023 Performed by: Edward Jolly, MD  Primary Purpose: Diagnostic/Therapeutic Indications: Low back pain severe enough to impact quality of life or function. 1. Lumbar facet arthropathy   2. Lumbar spondylosis   3. Chronic pain syndrome    NAS-11 Pain score:   Pre-procedure: 3 /10   Post-procedure: 0-No pain/10     Position / Prep / Materials:  Position: Prone  Prep solution: DuraPrep (Iodine Povacrylex [0.7% available iodine] and Isopropyl Alcohol, 74% w/w) Area Prepped: Posterolateral Lumbosacral Spine (Wide prep: From the lower border of the scapula down to the end of the tailbone and from flank to flank.)  Materials:  Tray: Block Needle(s):  Type: Spinal  Gauge (G): 22  Length: 3.5-in Qty: 2      H&P (Pre-op Assessment):  Marilyn Hendricks is a 47 y.o. (year old), female patient,  seen today for interventional treatment. She  has a past surgical history that includes Tubal ligation and Cholecystectomy. Marilyn Hendricks has a current medication list which includes the following prescription(s): acetaminophen, cyanocobalamin, wegovy, ibuprofen, and ketorolac, and the following Facility-Administered Medications: lactated ringers. Her primarily concern today is the Back Pain (Both side)  Initial Vital Signs:  Pulse/HCG Rate: 80ECG Heart Rate: 97 Temp: 98.1 F (36.7 C) Resp: 14 BP: 109/81 SpO2: 100 %  BMI: Estimated body mass index is 30.8 kg/m as calculated from the following:   Height as of this encounter: 5\' 1"  (1.549 m).   Weight as of this encounter: 163 lb (73.9 kg).  Risk Assessment: Allergies: Reviewed. She is allergic to doxycycline, beeswax, carrot [daucus carota], cats claw [uncaria tomentosa (cats claw)], and erythromycin.  Allergy Precautions: None required Coagulopathies: Reviewed. None identified.  Blood-thinner therapy: None at this time Active Infection(s): Reviewed. None identified. Marilyn Hendricks is afebrile  Site Confirmation: Marilyn Hendricks was asked to confirm the procedure and laterality before marking the site Procedure checklist: Completed Consent: Before the procedure and under the influence of no sedative(s), amnesic(s), or anxiolytics, the patient was informed of the treatment options, risks and possible complications. To fulfill our ethical and legal obligations, as recommended by the American Medical Association's Code of Ethics, I have informed the patient of my clinical impression; the nature and purpose of the treatment or procedure; the risks, benefits, and possible complications of the intervention; the alternatives, including doing nothing; the risk(s) and benefit(s) of the alternative treatment(s) or procedure(s); and the risk(s) and benefit(s) of doing nothing. The patient was provided information about the  general risks and possible complications  associated with the procedure. These may include, but are not limited to: failure to achieve desired goals, infection, bleeding, organ or nerve damage, allergic reactions, paralysis, and death. In addition, the patient was informed of those risks and complications associated to Spine-related procedures, such as failure to decrease pain; infection (i.e.: Meningitis, epidural or intraspinal abscess); bleeding (i.e.: epidural hematoma, subarachnoid hemorrhage, or any other type of intraspinal or peri-dural bleeding); organ or nerve damage (i.e.: Any type of peripheral nerve, nerve root, or spinal cord injury) with subsequent damage to sensory, motor, and/or autonomic systems, resulting in permanent pain, numbness, and/or weakness of one or several areas of the body; allergic reactions; (i.e.: anaphylactic reaction); and/or death. Furthermore, the patient was informed of those risks and complications associated with the medications. These include, but are not limited to: allergic reactions (i.e.: anaphylactic or anaphylactoid reaction(s)); adrenal axis suppression; blood sugar elevation that in diabetics may result in ketoacidosis or comma; water retention that in patients with history of congestive heart failure may result in shortness of breath, pulmonary edema, and decompensation with resultant heart failure; weight gain; swelling or edema; medication-induced neural toxicity; particulate matter embolism and blood vessel occlusion with resultant organ, and/or nervous system infarction; and/or aseptic necrosis of one or more joints. Finally, the patient was informed that Medicine is not an exact science; therefore, there is also the possibility of unforeseen or unpredictable risks and/or possible complications that may result in a catastrophic outcome. The patient indicated having understood very clearly. We have given the patient no guarantees and we have made no promises. Enough time was given to the patient to  ask questions, all of which were answered to the patient's satisfaction. Marilyn Hendricks has indicated that she wanted to continue with the procedure. Attestation: I, the ordering provider, attest that I have discussed with the patient the benefits, risks, side-effects, alternatives, likelihood of achieving goals, and potential problems during recovery for the procedure that I have provided informed consent. Date  Time: 08/18/2023  9:44 AM   Pre-Procedure Preparation:  Monitoring: As per clinic protocol. Respiration, ETCO2, SpO2, BP, heart rate and rhythm monitor placed and checked for adequate function Safety Precautions: Patient was assessed for positional comfort and pressure points before starting the procedure. Time-out: I initiated and conducted the "Time-out" before starting the procedure, as per protocol. The patient was asked to participate by confirming the accuracy of the "Time Out" information. Verification of the correct person, site, and procedure were performed and confirmed by me, the nursing staff, and the patient. "Time-out" conducted as per Joint Commission's Universal Protocol (UP.01.01.01). Time: 1029 Start Time: 1030 hrs.  Description of Procedure:          Laterality: (see above) Targeted Levels: (see above)  Safety Precautions: Aspiration looking for blood return was conducted prior to all injections. At no point did we inject any substances, as a needle was being advanced. Before injecting, the patient was told to immediately notify me if she was experiencing any new onset of "ringing in the ears, or metallic taste in the mouth". No attempts were made at seeking any paresthesias. Safe injection practices and needle disposal techniques used. Medications properly checked for expiration dates. SDV (single dose vial) medications used. After the completion of the procedure, all disposable equipment used was discarded in the proper designated medical waste containers. Local  Anesthesia: Protocol guidelines were followed. The patient was positioned over the fluoroscopy table. The area was prepped in the usual manner.  The time-out was completed. The target area was identified using fluoroscopy. A 12-in long, straight, sterile hemostat was used with fluoroscopic guidance to locate the targets for each level blocked. Once located, the skin was marked with an approved surgical skin marker. Once all sites were marked, the skin (epidermis, dermis, and hypodermis), as well as deeper tissues (fat, connective tissue and muscle) were infiltrated with a small amount of a short-acting local anesthetic, loaded on a 10cc syringe with a 25G, 1.5-in  Needle. An appropriate amount of time was allowed for local anesthetics to take effect before proceeding to the next step. Local Anesthetic: Lidocaine 2.0% The unused portion of the local anesthetic was discarded in the proper designated containers. Technical description of process:   L3 Medial Branch Nerve Block (MBB): The target area for the L3 medial branch is at the junction of the postero-lateral aspect of the superior articular process and the superior, posterior, and medial edge of the transverse process of L4. Under fluoroscopic guidance, a Quincke needle was inserted until contact was made with os over the superior postero-lateral aspect of the pedicular shadow (target area). After negative aspiration for blood, 2mL of the nerve block solution was injected without difficulty or complication. The needle was removed intact. L4 Medial Branch Nerve Block (MBB): The target area for the L4 medial branch is at the junction of the postero-lateral aspect of the superior articular process and the superior, posterior, and medial edge of the transverse process of L5. Under fluoroscopic guidance, a Quincke needle was inserted until contact was made with os over the superior postero-lateral aspect of the pedicular shadow (target area). After negative  aspiration for blood, 2mL of the nerve block solution was injected without difficulty or complication. The needle was removed intact. L5 Medial Branch Nerve Block (MBB): The target area for the L5 medial branch is at the junction of the postero-lateral aspect of the superior articular process and the superior, posterior, and medial edge of the sacral ala. Under fluoroscopic guidance, a Quincke needle was inserted until contact was made with os over the superior postero-lateral aspect of the pedicular shadow (target area). After negative aspiration for blood, 2mL of the nerve block solution was injected without difficulty or complication. The needle was removed intact.   Once the entire procedure was completed, the treated area was cleaned, making sure to leave some of the prepping solution back to take advantage of its long term bactericidal properties.         Illustration of the posterior view of the lumbar spine and the posterior neural structures. Laminae of L2 through S1 are labeled. DPRL5, dorsal primary ramus of L5; DPRS1, dorsal primary ramus of S1; DPR3, dorsal primary ramus of L3; FJ, facet (zygapophyseal) joint L3-L4; I, inferior articular process of L4; LB1, lateral branch of dorsal primary ramus of L1; IAB, inferior articular branches from L3 medial branch (supplies L4-L5 facet joint); IBP, intermediate branch plexus; MB3, medial branch of dorsal primary ramus of L3; NR3, third lumbar nerve root; S, superior articular process of L5; SAB, superior articular branches from L4 (supplies L4-5 facet joint also); TP3, transverse process of L3.   Facet Joint Innervation (* possible contribution)  L1-2 T12, L1 (L2*)  Medial Branch  L2-3 L1, L2 (L3*)         "          "  L3-4 L2, L3 (L4*)         "          "  L4-5 L3, L4 (L5*)         "          "  L5-S1 L4, L5, S1          "          "    Vitals:   08/18/23 1030 08/18/23 1035 08/18/23 1038 08/18/23 1045  BP: (!) 120/94 (!) 111/94 (!)  110/90 105/79  Pulse:      Resp: (!) 25 16 16 16   Temp:      SpO2: 98% 100% 100% 96%  Weight:      Height:         End Time: 1037 hrs.  Imaging Guidance (Spinal):          Type of Imaging Technique: Fluoroscopy Guidance (Spinal) Indication(s): Assistance in needle guidance and placement for procedures requiring needle placement in or near specific anatomical locations not easily accessible without such assistance. Exposure Time: Please see nurses notes. Contrast: None used. Fluoroscopic Guidance: I was personally present during the use of fluoroscopy. "Tunnel Vision Technique" used to obtain the best possible view of the target area. Parallax error corrected before commencing the procedure. "Direction-depth-direction" technique used to introduce the needle under continuous pulsed fluoroscopy. Once target was reached, antero-posterior, oblique, and lateral fluoroscopic projection used confirm needle placement in all planes. Images permanently stored in EMR. Interpretation: No contrast injected. I personally interpreted the imaging intraoperatively. Adequate needle placement confirmed in multiple planes. Permanent images saved into the patient's record.  Post-operative Assessment:  Post-procedure Vital Signs:  Pulse/HCG Rate: 8088 Temp: 98.1 F (36.7 C) Resp: 16 BP: 105/79 SpO2: 96 %  EBL: None  Complications: No immediate post-treatment complications observed by team, or reported by patient.  Note: The patient tolerated the entire procedure well. A repeat set of vitals were taken after the procedure and the patient was kept under observation following institutional policy, for this type of procedure. Post-procedural neurological assessment was performed, showing return to baseline, prior to discharge. The patient was provided with post-procedure discharge instructions, including a section on how to identify potential problems. Should any problems arise concerning this procedure, the  patient was given instructions to immediately contact us, at any time, without hesitation. In any case, we plan to contact the patient by telephone for a follow-up status report regarding this interventional procedure.  Comments:  No additional relevant information.  Plan of Care (POC)  Orders:  Orders Placed This Encounter  Procedures   DG PAIN [facility] C-ARM 1-60 MIN NO REPORT    Intraoperative interpretation by procedural physician at [facility].    Standing Status:   Standing    Number of Occurrences:   1    Order Specific Question:   Reason for exam:    Answer:   Assistance in needle guidance and placement for procedures requiring needle placement in or near specific anatomical locations not easily accessible without such assistance.    Medications ordered for procedure: Meds ordered this encounter  Medications   lidocaine (XYLOCAINE) 2 % (with pres) injection 400 mg   lactated ringers infusion   midazolam (VERSED) injection 0.5-2 mg    Make sure Flumazenil is available in the pyxis when using this medication. If oversedation occurs, administer 0.2 mg IV over 15 sec. If after 45 sec no response, administer 0.2 mg again over 1 min; may repeat at 1 min intervals; not to exceed 4 doses (1 mg)   dexamethasone (DECADRON) injection 20 mg   ropivacaine (  PF) 2 mg/mL (0.2%) (NAROPIN) injection 18 mL   Medications administered: We administered lidocaine, lactated ringers, midazolam, dexamethasone, and ropivacaine (PF) 2 mg/mL (0.2%).  See the medical record for exact dosing, route, and time of administration.  Follow-up plan:   Return in about 2 weeks (around 09/01/2023) for PPE, VV.      Recent Visits Date Type Provider Dept  08/04/23 Office Visit Edward Jolly, MD [facility]  06/30/23 Procedure visit Edward Jolly, MD [facility]  06/08/23 Office Visit Edward Jolly, MD [facility]  Showing recent visits within past 90 days and meeting all  other requirements Today's Visits Date Type Provider Dept  08/18/23 Procedure visit Edward Jolly, MD [facility]  Showing today's visits and meeting all other requirements Future Appointments Date Type Provider Dept  09/01/23 Appointment Edward Jolly, MD [facility]  Showing future appointments within next 90 days and meeting all other requirements  Disposition: Discharge home  Discharge (Date  Time): 08/18/2023; 1050 hrs.   Primary Care Physician: Alm Bustard, NP Location: [facility] Note by: Edward Jolly, MD (TTS technology used. I apologize for any typographical errors that were not detected and corrected.) Date: 08/18/2023; Time: 11:06 AM  Disclaimer:  Medicine is not an Visual merchandiser. The only guarantee in medicine is that nothing is guaranteed. It is important to note that the decision to proceed with this intervention was based on the information collected from the patient. The Data and conclusions were drawn from the patient's questionnaire, the interview, and the physical examination. Because the information was provided in large part by the patient, it cannot be guaranteed that it has not been purposely or unconsciously manipulated. Every effort has been made to obtain as much relevant data as possible for this evaluation. It is important to note that the conclusions that lead to this procedure are derived in large part from the available data. Always take into account that the treatment will also be dependent on availability of resources and existing treatment guidelines, considered by other Pain Management Practitioners as being common knowledge and practice, at the time of the intervention. For Medico-Legal purposes, it is also important to point out that variation in procedural techniques and pharmacological choices are the acceptable norm. The indications, contraindications, technique, and results of the above procedure  should only be interpreted and judged by a Board-Certified Interventional Pain Specialist with extensive familiarity and expertise in the same exact procedure and technique.

## 2023-08-18 NOTE — Patient Instructions (Signed)

## 2023-08-18 NOTE — Progress Notes (Signed)
Safety precautions to be maintained throughout the outpatient stay will include: orient to surroundings, keep bed in low position, maintain call bell within reach at all times, provide assistance with transfer out of bed and ambulation.  

## 2023-08-19 ENCOUNTER — Telehealth: Payer: Self-pay

## 2023-08-19 NOTE — Telephone Encounter (Signed)
Post procedure follow up.  Patinet states she is doing good.  

## 2023-09-01 ENCOUNTER — Ambulatory Visit
Payer: Medicaid Other | Attending: Student in an Organized Health Care Education/Training Program | Admitting: Student in an Organized Health Care Education/Training Program

## 2023-09-01 DIAGNOSIS — G894 Chronic pain syndrome: Secondary | ICD-10-CM

## 2023-09-01 DIAGNOSIS — M47816 Spondylosis without myelopathy or radiculopathy, lumbar region: Secondary | ICD-10-CM

## 2023-09-01 NOTE — Progress Notes (Signed)
Patient: Marilyn Hendricks  Service Category: E/M  Provider: Edward Jolly, MD  DOB: 08/29/1976  DOS: 09/01/2023  Location: Office  MRN: [id number]  Setting: Ambulatory outpatient  Referring Provider: Alm Bustard, NP  Type: Established Patient  Specialty: Interventional Pain Management  PCP: Alm Bustard, NP  Location: Remote location  Delivery: TeleHealth     Virtual Encounter - Pain Management PROVIDER NOTE: Information contained herein reflects review and annotations entered in association with encounter. Interpretation of such information and data should be left to medically-trained personnel. Information provided to patient can be located elsewhere in the medical record under "Patient Instructions". Document created using STT-dictation technology, any transcriptional errors that may result from process are unintentional.    Contact & Pharmacy Preferred: [phone] Home: [phone] (home) Mobile: [phone] (mobile) E-mail: No e-mail address on record  [facility] - [place], [place] - [address] [place] [address] Phone: [phone] Fax: [phone]  [id number] - Closed - [place], [place] - [address] [place] [address] Phone: [phone] Fax: [phone]  [facility] - [place], [place] - [address] [place] [address] Phone: [phone] Fax: [phone]  [facility] [address] [place] - [address] [place] [address] Phone: [phone] Fax: [phone]   Pre-screening  Ms. Taliercio offered "in-person" vs "virtual" encounter. She indicated preferring virtual for this encounter.   Reason COVID-19*  Social distancing based on CDC and AMA recommendations.   I contacted MABREY QUARANTA on 09/01/2023 via telephone.      I clearly identified myself as Edward Jolly, MD. I verified that I was speaking with the correct person  using two identifiers (Name: Marilyn Hendricks, and date of birth: 05/03/1976).  Consent I sought verbal advanced consent from Lucienne Capers for virtual visit interactions. I informed Ms. Archibeque of possible security and privacy concerns, risks, and limitations associated with providing "not-in-person" medical evaluation and management services. I also informed Ms. Livermore of the availability of "in-person" appointments. Finally, I informed her that there would be a charge for the virtual visit and that she could be  personally, fully or partially, financially responsible for it. Ms. Silvernale expressed understanding and agreed to proceed.   Historic Elements   [name] is a 47 y.o. year old, female patient evaluated today after our last contact on 08/18/2023. Ms. Braniff  has a past medical history of ETD (eustachian tube dysfunction) and GERD (gastroesophageal reflux disease). She also  has a past surgical history that includes Tubal ligation and Cholecystectomy. Ms. Ganster has a current medication list which includes the following prescription(s): acetaminophen, cyanocobalamin, wegovy, ibuprofen, and ketorolac. She  reports that she has been smoking cigarettes. She has a 13 pack-year smoking history. She has never used smokeless tobacco. She reports current drug use. Frequency: 7.00 times per week. Drug: Marijuana. She reports that she does not drink alcohol. Ms. Lagorio is allergic to doxycycline, beeswax, carrot [daucus carota], cats claw [uncaria tomentosa (cats claw)], and erythromycin.  BMI: Estimated body mass index is 30.8 kg/m as calculated from the following:   Height as of 08/18/23: 5\' 1"  (1.549 m).   Weight as of 08/18/23: 163 lb (73.9 kg). Last encounter: 08/04/2023. Last procedure: 08/18/2023.  HPI  Today, she is being contacted for a post-procedure assessment.   Post-procedure evaluation   Procedure: Lumbar Facet, Medial Branch Block(s) #2  Laterality: Bilateral  Level: L3,  L4, and L5 Medial Branch Level(s). Injecting these levels blocks the L3-4 and L4-5 lumbar facet joints. Imaging: Fluoroscopic guidance         Anesthesia: Local anesthesia (1-2% Lidocaine) Sedation: Minimal Sedation                       DOS: 08/18/2023 Performed by: Edward Jolly, MD  Primary Purpose: Diagnostic/Therapeutic Indications: Low back pain severe enough to impact quality of life or function. 1. Lumbar facet arthropathy   2. Lumbar spondylosis   3. Chronic pain syndrome    NAS-11 Pain score:   Pre-procedure: 3 /10   Post-procedure: 0-No pain/10      Effectiveness:  Initial hour after procedure: 100 %  Subsequent 4-6 hours post-procedure: 100 %  Analgesia past initial 6 hours: 100 % (lasting 8-9 days)  Ongoing improvement:  Analgesic:  back to baseline    Laboratory Chemistry Profile   Renal Lab Results  Component Value Date   BUN 13 11/02/2022   CREATININE 0.87 11/02/2022   GFRAA >60 10/29/2019   GFRNONAA >60 11/02/2022    Hepatic Lab Results  Component Value Date   AST 28 11/02/2022   ALT 51 (H) 11/02/2022   ALBUMIN 4.0 11/02/2022   ALKPHOS 75 11/02/2022   LIPASE 18 10/29/2019    Electrolytes Lab Results  Component Value Date   NA 135 11/02/2022   K 3.9 11/02/2022   CL 100 11/02/2022   CALCIUM 9.0 11/02/2022    Bone No results found for: "VD25OH", "VD125OH2TOT", "UJ8119JY7", "WG9562ZH0", "25OHVITD1", "25OHVITD2", "25OHVITD3", "TESTOFREE", "TESTOSTERONE"  Inflammation (CRP: Acute Phase) (ESR: Chronic Phase) Lab Results  Component Value Date   CRP 1.8 (H) 11/02/2022         Note: Above Lab results reviewed.    Assessment  The primary encounter diagnosis was Lumbar facet arthropathy. Diagnoses of Lumbar spondylosis and Chronic pain syndrome were also pertinent to this visit.  Plan of Care  Patient is status post 2 sets of diagnostic lumbar facet medial branch nerve blocks on 06/30/2023 followed by 08/18/2023 bilaterally at L3, L4, L5.  Both  of these diagnostic nerve blocks provided greater than 80% pain relief for at least 1 week with gradual return of pain thereafter.  She also endorses improvement in ability to perform ADLs during the 7 days as well and improvement in sleep.  We discussed bilateral lumbar radiofrequency ablation for the purpose of obtaining longer-term pain relief.  Risk and benefits reviewed and patient would like to proceed.   Orders:  Orders Placed This Encounter  Procedures   Radiofrequency,Lumbar    Standing Status:   Future    Standing Expiration Date:   11/30/2023    Scheduling Instructions:     Side(s): Bilateral     Level(s): L3, L4, L5, Medial Branch Nerve(s)     Sedation: With Sedation     Scheduling Timeframe: As soon as pre-approved    Order Specific Question:   Where will this procedure be performed?    Answer:   [facility]   Follow-up plan:   Return in about 26 days (around 09/27/2023) for B/L L3, 4, 5 RFA , in clinic IV Versed (block 40 mins).      Recent Visits Date Type Provider Dept  08/18/23 Procedure visit Edward Jolly, MD [facility]  08/04/23 Office Visit Edward Jolly, MD [facility]  06/30/23 Procedure visit Edward Jolly, MD [facility]  06/08/23 Office Visit Edward Jolly,  MD [facility]  Showing recent visits within past 90 days and meeting all other requirements Today's Visits Date Type Provider Dept  09/01/23 Office Visit Edward Jolly, MD [facility]  Showing today's visits and meeting all other requirements Future Appointments No visits were found meeting these conditions. Showing future appointments within next 90 days and meeting all other requirements  I discussed the assessment and treatment plan with the patient. The patient was provided an opportunity to ask questions and all were answered. The patient agreed with the plan and demonstrated an understanding of the instructions.  Patient advised to  call back or seek an in-person evaluation if the symptoms or condition worsens.  Duration of encounter: .  Note by: Edward Jolly, MD Date: 09/01/2023; Time: 9:03 AM

## 2023-09-27 ENCOUNTER — Ambulatory Visit
Payer: Medicaid Other | Attending: Student in an Organized Health Care Education/Training Program | Admitting: Student in an Organized Health Care Education/Training Program

## 2023-09-27 ENCOUNTER — Encounter: Payer: Self-pay | Admitting: Student in an Organized Health Care Education/Training Program

## 2023-09-27 ENCOUNTER — Ambulatory Visit
Admission: RE | Admit: 2023-09-27 | Discharge: 2023-09-27 | Disposition: A | Discharge status: Home / Self Care | Payer: Medicaid Other | Source: Ambulatory Visit | Attending: Student in an Organized Health Care Education/Training Program | Admitting: Student in an Organized Health Care Education/Training Program

## 2023-09-27 VITALS — BP 133/67 | HR 85 | Temp 97.3°F | Resp 14 | Ht 61.5 in | Wt 156.6 lb

## 2023-09-27 DIAGNOSIS — M5412 Radiculopathy, cervical region: Secondary | ICD-10-CM | POA: Diagnosis present

## 2023-09-27 DIAGNOSIS — M47816 Spondylosis without myelopathy or radiculopathy, lumbar region: Secondary | ICD-10-CM | POA: Diagnosis not present

## 2023-09-27 DIAGNOSIS — G894 Chronic pain syndrome: Secondary | ICD-10-CM | POA: Diagnosis present

## 2023-09-27 MED ORDER — IOHEXOL 180 MG/ML  SOLN: 10.0000 mL | Freq: Once | INTRAMUSCULAR | Status: DC

## 2023-09-27 MED ORDER — DEXAMETHASONE SODIUM PHOSPHATE 10 MG/ML IJ SOLN
20.0000 mg | Freq: Once | INTRAMUSCULAR | Status: AC
Start: 2023-09-27 — End: 2023-09-27
  Administered 2023-09-27: 20 mg
  Filled 2023-09-27: qty 2

## 2023-09-27 MED ORDER — ROPIVACAINE HCL 2 MG/ML IJ SOLN
18.0000 mL | Freq: Once | INTRAMUSCULAR | Status: AC
  Administered 2023-09-27: 18 mL via PERINEURAL
  Filled 2023-09-27: qty 20

## 2023-09-27 MED ORDER — LIDOCAINE HCL 2 % IJ SOLN
20.0000 mL | Freq: Once | INTRAMUSCULAR | Status: AC
Start: 2023-09-27 — End: 2023-09-27
  Administered 2023-09-27: 400 mg
  Filled 2023-09-27: qty 20

## 2023-09-27 MED ORDER — MIDAZOLAM HCL 2 MG/2ML IJ SOLN
0.5000 mg | Freq: Once | INTRAMUSCULAR | Status: AC
  Administered 2023-09-27: 2 mg via INTRAVENOUS
  Filled 2023-09-27: qty 2

## 2023-09-27 MED ORDER — LACTATED RINGERS IV SOLN: Freq: Once | INTRAVENOUS | Status: AC

## 2023-09-27 NOTE — Patient Instructions (Signed)
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
## 2023-09-27 NOTE — Progress Notes (Signed)
Safety precautions to be maintained throughout the outpatient stay will include: orient to surroundings, keep bed in low position, maintain call bell within reach at all times, provide assistance with transfer out of bed and ambulation.  

## 2023-09-28 ENCOUNTER — Telehealth: Payer: Self-pay | Admitting: *Deleted

## 2023-09-28 NOTE — Telephone Encounter (Signed)
No problems post procedure. 

## 2023-11-08 ENCOUNTER — Ambulatory Visit: Payer: Medicaid Other | Admitting: Student in an Organized Health Care Education/Training Program

## 2023-11-15 ENCOUNTER — Ambulatory Visit (INDEPENDENT_AMBULATORY_CARE_PROVIDER_SITE_OTHER): Payer: Medicaid Other

## 2023-11-15 ENCOUNTER — Ambulatory Visit
Admission: EM | Admit: 2023-11-15 | Discharge: 2023-11-15 | Disposition: A | Discharge status: Home / Self Care | Payer: Medicaid Other

## 2023-11-15 ENCOUNTER — Encounter: Payer: Self-pay | Admitting: Emergency Medicine

## 2023-11-15 DIAGNOSIS — R051 Acute cough: Secondary | ICD-10-CM

## 2023-11-15 DIAGNOSIS — J069 Acute upper respiratory infection, unspecified: Secondary | ICD-10-CM

## 2023-11-15 MED ORDER — PROMETHAZINE-DM 6.25-15 MG/5ML PO SYRP: 5.0000 mL | ORAL_SOLUTION | Freq: Four times a day (QID) | ORAL | 0 refills | Status: DC | PRN

## 2023-11-15 MED ORDER — AEROCHAMBER MV MISC: 2 refills | Status: DC

## 2023-11-15 MED ORDER — ALBUTEROL SULFATE HFA 108 (90 BASE) MCG/ACT IN AERS: 2.0000 | INHALATION_SPRAY | RESPIRATORY_TRACT | 0 refills | Status: DC | PRN

## 2023-11-15 MED ORDER — IPRATROPIUM BROMIDE 0.06 % NA SOLN: 2.0000 | Freq: Four times a day (QID) | NASAL | 12 refills | Status: DC

## 2023-11-15 MED ORDER — BENZONATATE 100 MG PO CAPS: 200.0000 mg | ORAL_CAPSULE | Freq: Three times a day (TID) | ORAL | 0 refills | Status: DC

## 2023-11-15 NOTE — ED Triage Notes (Addendum)
Patient presents with c/o nasal congestion, productive cough and dyspnea with exertion x 3 days. Patient states she is taking Tylenol for the symptoms. Patient states she was given amoxicillin on Thursday for an ear infection.

## 2023-11-15 NOTE — ED Provider Notes (Addendum)
[facility]    CSN: [id number] Arrival date & time: 11/15/23  7564      History   Chief Complaint Chief Complaint  Patient presents with   Cough   Nasal Congestion   Shortness of Breath   Headache    HPI Marilyn Hendricks is a 48 y.o. female.   HPI  48 year old female with past medical history significant for GERD, eustachian tube dysfunction, marijuana and tobacco use, obstructive chronic bronchitis, prediabetes, and chronic pain syndrome presents for evaluation of worsening respiratory symptoms over the last 4 days.  She was seen by her PCP 4 days ago at that time was reporting a nighttime cough that was dry.  The cough began after a trip to the beach where she experienced vomiting after drinking alcohol.  She was also experiencing right-sided ear pain and sinus pressure without fever or bodyaches.  She was started on Augmentin by her PCP and reports that she feels her symptoms have worsened since starting the Augmentin.  She is endorsing a fever with a Tmax of 100, nasal congestion with clear nasal discharge, productive cough with clear sputum, posttussive emesis, shortness breath, wheezing, dyspnea on exertion.  She denies any sore throat or OB/GYN and GI symptoms.  Past Medical History:  Diagnosis Date   ETD (eustachian tube dysfunction)    GERD (gastroesophageal reflux disease)     Patient Active Problem List   Diagnosis Date Noted   Lumbar spondylosis 03/02/2023   Cervical facet joint syndrome 03/02/2023   Cervical radicular pain 03/02/2023   Chronic pain syndrome 03/02/2023   Prediabetes 02/17/2023   Lymphocytosis 11/02/2022   Cyst of skin 01/26/2018   Encounter for screening mammogram for breast cancer 01/26/2018   Obstructive chronic bronchitis without exacerbation (HCC) 04/09/2014   Tobacco abuse 04/09/2014   Marijuana abuse 04/09/2014    Past Surgical History:  Procedure Laterality Date   CHOLECYSTECTOMY     TUBAL LIGATION      OB History      Gravida  5   Para  3   Term      Preterm      AB  1   Living  3      SAB  1   IAB      Ectopic      Multiple  3   Live Births  3        Obstetric Comments  1st Menstrual Cycle:  12  1st Pregnancy:  14           Home Medications    Prior to Admission medications   Medication Sig Start Date End Date Taking? Authorizing Provider  albuterol (VENTOLIN HFA) 108 (90 Base) MCG/ACT inhaler Inhale 2 puffs into the lungs every 4 (four) hours as needed. 11/15/23  Yes Becky Augusta, NP  amoxicillin-clavulanate (AUGMENTIN) 875-125 MG tablet Take by mouth. 11/11/23 11/18/23 Yes [provider]  benzonatate (TESSALON) 100 MG capsule Take 2 capsules (200 mg total) by mouth every 8 (eight) hours. 11/15/23  Yes Becky Augusta, NP  diclofenac (VOLTAREN) 50 MG EC tablet Take by mouth. 11/11/23 12/11/23 Yes [provider]  ipratropium (ATROVENT) 0.06 % nasal spray Place 2 sprays into both nostrils 4 (four) times daily. 11/15/23  Yes Becky Augusta, NP  promethazine-dextromethorphan (PROMETHAZINE-DM) 6.25-15 MG/5ML syrup Take 5 mLs by mouth 4 (four) times daily as needed. 11/15/23  Yes Becky Augusta, NP  Spacer/Aero-Holding Chambers (AEROCHAMBER MV) inhaler Use as instructed 11/15/23  Yes Becky Augusta, NP  acetaminophen (TYLENOL) 325 MG tablet Take 650 mg by mouth every 6 (six) hours as needed.    [provider]  cyanocobalamin (VITAMIN B12) 1000 MCG/ML injection Inject 1,000 mcg into the muscle every 30 (thirty) days. 05/07/23 05/01/24  [provider]  ibuprofen (ADVIL) 800 MG tablet Take 1 tablet (800 mg total) by mouth every 8 (eight) hours as needed. 04/24/23   Menshew, Charlesetta Ivory, PA-C    Family History Family History  Problem Relation Age of Onset   Breast cancer Mother 17       double mastectomy in 1994   Asthma Mother    Esophagitis Father    Cancer Maternal Grandfather        prostate    Social History Social History   Tobacco Use    Smoking status: Every Day    Current packs/day: 0.50    Average packs/day: 0.5 packs/day for 26.0 years (13.0 ttl pk-yrs)    Types: Cigarettes   Smokeless tobacco: Never   Tobacco comments:    Vapes, weed,  Vaping Use   Vaping status: Some Days   Substances: Flavoring  Substance Use Topics   Alcohol use: No   Drug use: Yes    Frequency: 7.0 times per week    Types: Marijuana    Comment: last night used     Allergies   Doxycycline, Beeswax, Carrot [daucus carota], Cats claw [uncaria tomentosa (cats claw)], Erythromycin, and Prednisone   Review of Systems Review of Systems  Constitutional:  Positive for fever.  HENT:  Positive for congestion, ear pain, rhinorrhea and sinus pressure. Negative for sore throat.   Respiratory:  Positive for cough, shortness of breath and wheezing.   Gastrointestinal:  Negative for diarrhea, nausea and vomiting.     Physical Exam Triage Vital Signs ED Triage Vitals  Encounter Vitals Group     BP 11/15/23 0826 102/75     Systolic BP Percentile --      Diastolic BP Percentile --      Pulse Rate 11/15/23 0826 96     Resp 11/15/23 0826 18     Temp 11/15/23 0826 98.3 F (36.8 C)     Temp Source 11/15/23 0826 Oral     SpO2 11/15/23 0826 98 %     Weight --      Height --      Head Circumference --      Peak Flow --      Pain Score 11/15/23 0828 0     Pain Loc --      Pain Education --      Exclude from Growth Chart --    No data found.  Updated Vital Signs BP 102/75 (BP Location: Left Arm)   Pulse 96   Temp 98.3 F (36.8 C) (Oral)   Resp 18   LMP 08/08/2021 (Approximate)   SpO2 98%   Visual Acuity Right Eye Distance:   Left Eye Distance:   Bilateral Distance:    Right Eye Near:   Left Eye Near:    Bilateral Near:     Physical Exam Vitals and nursing note reviewed.  Constitutional:      Appearance: Normal appearance. She is not ill-appearing.  HENT:     Head: Normocephalic and atraumatic.     Right Ear: Tympanic  membrane, ear canal and external ear normal. There is no impacted cerumen.     Left Ear: Tympanic membrane, ear canal and external ear normal. There is no impacted cerumen.  Nose: Congestion and rhinorrhea present.     Comments: Nasal mucosa is edematous and erythematous with clear discharge in both nares.    Mouth/Throat:     Mouth: Mucous membranes are moist.     Pharynx: Oropharynx is clear. No oropharyngeal exudate or posterior oropharyngeal erythema.  Neck:     Comments: Has tenderness to palpation of the anterior cervical region bilaterally without appreciable lymphadenopathy. Cardiovascular:     Rate and Rhythm: Normal rate and regular rhythm.     Pulses: Normal pulses.     Heart sounds: Normal heart sounds. No murmur heard.    No friction rub. No gallop.  Pulmonary:     Effort: Pulmonary effort is normal.     Breath sounds: Normal breath sounds. No wheezing, rhonchi or rales.  Musculoskeletal:     Cervical back: Normal range of motion and neck supple. Tenderness present. No rigidity.  Lymphadenopathy:     Cervical: No cervical adenopathy.  Skin:    General: Skin is warm and dry.     Capillary Refill: Capillary refill takes less than 2 seconds.     Findings: No rash.  Neurological:     General: No focal deficit present.     Mental Status: She is alert and oriented to person, place, and time.      UC Treatments / Results  Labs (all labs ordered are listed, but only abnormal results are displayed) Labs Reviewed - No data to display  EKG   Radiology No results found.  Procedures Procedures (including critical care time)  Medications Ordered in UC Medications - No data to display  Initial Impression / Assessment and Plan / UC Course  I have reviewed the triage vital signs and the nursing notes.  Pertinent labs & imaging results that were available during my care of the patient were reviewed by me and considered in my medical decision making (see chart for  details).   Patient is a nontoxic-appearing 48 year old female presenting for evaluation of respiratory symptoms as outlined HPI above.  The patient is reporting DOE with shortness breath and wheezing, however, she is able to speak in full sentence without dyspnea or tachypnea.  Her lung sounds are clear to auscultation in all fields.  However, her cough developed after she had an episode of vomiting due to drinking alcohol so there is a question for possible aspiration.  I will obtain a chest x-ray to rule out any acute cardiopulmonary pathology.  Chest x-ray dependently reviewed and evaluated by me.  Impression: Lung fields are well aerated without evidence of infiltrate or effusion.  Cardiomediastinal silhouette appears normal.  Radiology overread is pending. Radiology impression states no active cardiopulmonary disease.  I will discharge patient home with a diagnosis of URI with cough and congestion have her finish the Augmentin she was previously prescribed.  I will add Tessalon Perles and Promethazine DM cough syrup to her medications to help with her symptoms along with Atrovent nasal spray.  Return precautions provided.  Work note provided.   Final Clinical Impressions(s) / UC Diagnoses   Final diagnoses:  Acute cough  URI with cough and congestion     Discharge Instructions      Your chest x-ray did not show any evidence of pneumonia.  You do have a respiratory infection based on your physical exam findings.  Finish the Augmentin that you were previously prescribed.  Use over-the-counter Tylenol and ibuprofen according the package instructions needed for any fever or pain.  Use the albuterol  inhaler, with the spacer, 1 to 2 puffs every 4-6 hours, as needed for shortness of breath or wheezing.  Use the Atrovent nasal spray, 2 squirts in each nostril every 6 hours, as needed for runny nose and postnasal drip.  Use the Tessalon Perles every 8 hours during the day.  Take them with a  small sip of water.  They may give you some numbness to the base of your tongue or a metallic taste in your mouth, this is normal.  Use the Promethazine DM cough syrup at bedtime for cough and congestion.  It will make you drowsy so do not take it during the day.  Return for reevaluation or see your primary care provider for any new or worsening symptoms.        ED Prescriptions     Medication Sig Dispense Auth. Provider   benzonatate (TESSALON) 100 MG capsule Take 2 capsules (200 mg total) by mouth every 8 (eight) hours. 21 capsule Becky Augusta, NP   ipratropium (ATROVENT) 0.06 % nasal spray Place 2 sprays into both nostrils 4 (four) times daily. 15 mL Becky Augusta, NP   promethazine-dextromethorphan (PROMETHAZINE-DM) 6.25-15 MG/5ML syrup Take 5 mLs by mouth 4 (four) times daily as needed. 118 mL Becky Augusta, NP   Spacer/Aero-Holding Chambers (AEROCHAMBER MV) inhaler Use as instructed 1 each Becky Augusta, NP   albuterol (VENTOLIN HFA) 108 (90 Base) MCG/ACT inhaler Inhale 2 puffs into the lungs every 4 (four) hours as needed. 18 g Becky Augusta, NP      PDMP not reviewed this encounter.   Becky Augusta, NP 11/15/23 1610    Becky Augusta, NP 11/15/23 [phone]

## 2023-11-15 NOTE — Discharge Instructions (Addendum)
Your chest x-ray did not show any evidence of pneumonia.  You do have a respiratory infection based on your physical exam findings.  Finish the Augmentin that you were previously prescribed.  Use over-the-counter Tylenol and ibuprofen according the package instructions needed for any fever or pain.  Use the albuterol inhaler, with the spacer, 1 to 2 puffs every 4-6 hours, as needed for shortness of breath or wheezing.  Use the Atrovent nasal spray, 2 squirts in each nostril every 6 hours, as needed for runny nose and postnasal drip.  Use the Tessalon Perles every 8 hours during the day.  Take them with a small sip of water.  They may give you some numbness to the base of your tongue or a metallic taste in your mouth, this is normal.  Use the Promethazine DM cough syrup at bedtime for cough and congestion.  It will make you drowsy so do not take it during the day.  Return for reevaluation or see your primary care provider for any new or worsening symptoms.

## 2023-11-16 ENCOUNTER — Ambulatory Visit
Payer: Medicaid Other | Attending: Student in an Organized Health Care Education/Training Program | Admitting: Student in an Organized Health Care Education/Training Program

## 2023-11-16 ENCOUNTER — Encounter: Payer: Self-pay | Admitting: Student in an Organized Health Care Education/Training Program

## 2023-11-16 DIAGNOSIS — G894 Chronic pain syndrome: Secondary | ICD-10-CM

## 2023-11-16 DIAGNOSIS — M5412 Radiculopathy, cervical region: Secondary | ICD-10-CM

## 2023-11-16 DIAGNOSIS — M47816 Spondylosis without myelopathy or radiculopathy, lumbar region: Secondary | ICD-10-CM

## 2023-11-16 NOTE — Progress Notes (Signed)
Patient: Marilyn Hendricks  Service Category: E/M  Provider: Edward Jolly, MD  DOB: January 02, 1976  DOS: 11/16/2023  Location: Office  MRN: [id number]  Setting: Ambulatory outpatient  Referring Provider: Alm Bustard, NP  Type: Established Patient  Specialty: Interventional Pain Management  PCP: Alm Bustard, NP  Location: Remote location  Delivery: TeleHealth     Virtual Encounter - Pain Management PROVIDER NOTE: Information contained herein reflects review and annotations entered in association with encounter. Interpretation of such information and data should be left to medically-trained personnel. Information provided to patient can be located elsewhere in the medical record under "Patient Instructions". Document created using STT-dictation technology, any transcriptional errors that may result from process are unintentional.    Contact & Pharmacy Preferred: [phone] Home: [phone] (home) Mobile: [phone] (mobile) E-mail: No e-mail address on record  [id number] [place], [place] - [address] [place] [address] Phone: [phone] Fax: [phone]   Pre-screening  [name] offered "in-person" vs "virtual" encounter. She indicated preferring virtual for this encounter.   Reason COVID-19*  Social distancing based on CDC and AMA recommendations.   I contacted PEAR[facility] REASONS on 11/16/2023 via telephone.      I clearly identified myself as Edward Jolly, MD. I verified that I was speaking with the correct person using two identifiers (Name: Marilyn Hendricks, and date of birth: [birth year]).  Consent I sought verbal advanced consent from Lucienne Capers for virtual visit interactions. I informed [name] of possible security and privacy concerns, risks, and limitations associated with providing "not-in-person" medical evaluation and management services. I also informed [name] of the availability of "in-person" appointments. Finally, I informed her that  there would be a charge for the virtual visit and that she could be  personally, fully or partially, financially responsible for it. Ms. Takeda expressed understanding and agreed to proceed.   Historic Elements   Marilyn Hendricks is a 48 y.o. year old, female patient evaluated today after our last contact on 09/27/2023. Marilyn Hendricks  has a past medical history of ETD (eustachian tube dysfunction) and GERD (gastroesophageal reflux disease). She also  has a past surgical history that includes Tubal ligation and Cholecystectomy. Ms. Haggard has a current medication list which includes the following prescription(s): acetaminophen, albuterol, amoxicillin-clavulanate, benzonatate, cyanocobalamin, diclofenac, ibuprofen, ipratropium, promethazine-dextromethorphan, and aerochamber mv. She  reports that she has been smoking cigarettes. She has a 13 pack-year smoking history. She has never used smokeless tobacco. She reports current drug use. Frequency: 7.00 times per week. Drug: Marijuana. She reports that she does not drink alcohol. Marilyn Hendricks is allergic to doxycycline, beeswax, carrot [daucus carota], cats claw [uncaria tomentosa (cats claw)], erythromycin, and prednisone.  BMI: Estimated body mass index is 29.11 kg/m as calculated from the following:   Height as of 09/27/23: 5' 1.5" (1.562 m).   Weight as of 09/27/23: 156 lb 9.6 oz (71 kg). Last encounter: 09/01/2023. Last procedure: 09/27/2023.  HPI  Today, she is being contacted for a post-procedure assessment.   Post-procedure evaluation    Procedure: Lumbar Facet, Medial Branch Radiofrequency Ablation (RFA) #1  Laterality: Bilateral (-50)  Level: L3, L4, and L5 Medial Branch Level(s). These levels will denervate the L3-4 and L4-5 lumbar facet joints.  Imaging: Fluoroscopy-guided         Anesthesia: Local anesthesia (1-2% Lidocaine) Sedation: Minimal Sedation  DOS: 09/27/2023  Performed by: Edward Jolly, MD  Purpose:  Therapeutic/Palliative Indications: Low back pain severe enough to impact quality of life or function. Indications: Lumbar facet arthropathy  Marilyn Hendricks has been dealing with the above chronic pain for longer than three months and has either failed to respond, was unable to tolerate, or simply did not get enough benefit from other more conservative therapies including, but not limited to: 1. Over-the-counter medications 2. Anti-inflammatory medications 3. Muscle relaxants 4. Membrane stabilizers 5. Opioids 6. Physical therapy and/or chiropractic manipulation 7. Modalities (Heat, ice, etc.) 8. Invasive techniques such as nerve blocks. Marilyn Hendricks has attained more than 50% relief of the pain from a series of diagnostic injections conducted in separate occasions.  Pain Score: Pre-procedure: 3 /10 Post-procedure: 0-No pain/10    Effectiveness:  Initial hour after procedure: 100 %  Subsequent 4-6 hours post-procedure: 100 %  Analgesia past initial 6 hours: 100 %  Ongoing improvement:  Analgesic:  100% Function: Marilyn Hendricks reports improvement in function ROM: Marilyn Hendricks reports improvement in ROM   Laboratory Chemistry Profile   Renal Lab Results  Component Value Date   BUN 13 11/02/2022   CREATININE 0.87 11/02/2022   GFRAA >60 10/29/2019   GFRNONAA >60 11/02/2022    Hepatic Lab Results  Component Value Date   AST 28 11/02/2022   ALT 51 (H) 11/02/2022   ALBUMIN 4.0 11/02/2022   ALKPHOS 75 11/02/2022   LIPASE 18 10/29/2019    Electrolytes Lab Results  Component Value Date   NA 135 11/02/2022   K 3.9 11/02/2022   CL 100 11/02/2022   CALCIUM 9.0 11/02/2022    Bone No results found for: "VD25OH", "VD125OH2TOT", "NW2956OZ3", "YQ6578IO9", "25OHVITD1", "25OHVITD2", "25OHVITD3", "TESTOFREE", "TESTOSTERONE"  Inflammation (CRP: Acute Phase) (ESR: Chronic Phase) Lab Results  Component Value Date   CRP 1.8 (H) 11/02/2022         Note: Above Lab results  reviewed.  Imaging  DG Chest 2 View CLINICAL DATA:  Productive cough for the past 3 days with shortness of breath and wheezing.  EXAM: CHEST - 2 VIEW  COMPARISON:  Chest x-ray dated October 24, 2022.  FINDINGS: The heart size and mediastinal contours are within normal limits. Both lungs are clear. The visualized skeletal structures are unremarkable.  IMPRESSION: No active cardiopulmonary disease.  Electronically Signed   By: Obie Dredge M.D.   On: 11/15/2023 09:42  CLINICAL DATA:  Initial evaluation for chronic neck pain extending into both shoulders.   EXAM: MRI CERVICAL SPINE WITHOUT CONTRAST   TECHNIQUE: Multiplanar, multisequence MR imaging of the cervical spine was performed. No intravenous contrast was administered.   COMPARISON:  Prior MRI from 06/15/2018.   FINDINGS: Alignment: Straightening with mild smooth reversal of the normal cervical lordosis. No listhesis.   Vertebrae: Vertebral body height maintained without acute or chronic fracture. Bone marrow signal intensity within normal limits. No discrete or worrisome osseous lesions. No abnormal marrow edema.   Cord: Normal signal and morphology.   Posterior Fossa, vertebral arteries, paraspinal tissues: Partially empty sella noted. Visualized brain otherwise unremarkable. Few small retention cyst noted at the nasopharynx. Craniocervical junction normal. Paraspinous soft tissues demonstrate no acute abnormality. Normal flow voids seen within the vertebral arteries bilaterally.   Disc levels:   C2-C3: Normal interspace. Mild bilateral facet hypertrophy. No canal or foraminal stenosis.   C3-C4: Minimal disc bulge. Mild left-sided facet hypertrophy. No canal or foraminal stenosis.   C4-C5: Mild disc bulge with uncovertebral spurring. Flattening of  the ventral thecal sac with minimal cord flattening, but no cord signal changes. Mild spinal stenosis with mild right C5 foraminal narrowing. Left  neural foramina remains patent.   C5-C6: Mild disc bulge with uncovertebral spurring. Flattening and partial effacement of the ventral thecal sac with resultant mild spinal stenosis. Mild to moderate right C6 foraminal narrowing. Left neural foramina remains patent.   C6-C7: Mild degenerative intervertebral disc space narrowing with diffuse disc bulge, asymmetric to the left. Flattening and partial effacement of the ventral thecal sac with resultant mild spinal stenosis. Superimposed uncovertebral spurring with resultant mild left C7 foraminal narrowing. Right neural foramen remains patent.   C7-T1:  Unremarkable.   IMPRESSION: 1. Mild multilevel cervical spondylosis with resultant mild diffuse spinal stenosis at C4-5 through C6-7. 2. Multifactorial degenerative changes with resultant mild right C5 and left C7 foraminal stenosis, with mild to moderate right C6 foraminal narrowing.    Assessment  The primary encounter diagnosis was Lumbar facet arthropathy. Diagnoses of Lumbar spondylosis, Chronic pain syndrome, and Cervical radicular pain were also pertinent to this visit.  Plan of Care  Patient is doing extremely well after her lumbar radiofrequency ablation.  She states that she does not have any low back pain and rates analgesic response at 100%. She continues to endorse pretty severe headaches and neck pain with occasional radiation into bilateral arms.  Her cervical MRI above shows right C5 and left C7 foraminal stenosis along with moderate right C6 foraminal stenosis.  At her last visit with me on 09/27/2023, she was scheduled for a cervical epidural steroid injection however she has not heard back regarding scheduling date for this.  I will reach out to my staff to see what happened.  I will see the patient back for her cervical ESI.  Orders:  Orders Placed This Encounter  Procedures   Cervical Epidural Injection    Sedation: Patient's choice. Purpose:  Diagnostic/Therapeutic Indication(s): Radiculitis and cervicalgia associater with cervical degenerative disc disease.    Standing Status:   Future    Expiration Date:   02/13/2024    Scheduling Instructions:     Procedure: Cervical Epidural Steroid Injection/Block     Level(s): C7-T1     Laterality: TBD     Timeframe: As soon as schedule allows    Where will this procedure be performed?:   [facility]             Rayvon Dakin   Follow-up plan:   Return in about 27 days (around 12/13/2023) for C-ESI.      BL L3-L5 RFA 09/27/23    Recent Visits Date Type Provider Dept  09/27/23 Procedure visit Edward Jolly, MD [facility]  09/01/23 Office Visit Edward Jolly, MD [facility]  08/18/23 Procedure visit Edward Jolly, MD [facility]  Showing recent visits within past 90 days and meeting all other requirements Today's Visits Date Type Provider Dept  11/16/23 Office Visit Edward Jolly, MD [facility]  Showing today's visits and meeting all other requirements Future Appointments No visits were found meeting these conditions. Showing future appointments within next 90 days and meeting all other requirements  I discussed the assessment and treatment plan with the patient. The patient was provided an opportunity to ask questions and all were answered. The patient agreed with the plan and demonstrated an understanding of the instructions.  Patient advised to call back or seek an in-person evaluation if the symptoms or condition worsens.  Duration of encounter: .  Note  by: Edward Jolly, MD Date: 11/16/2023; Time: 11:08 AM

## 2023-12-13 ENCOUNTER — Encounter: Payer: Self-pay | Admitting: Student in an Organized Health Care Education/Training Program

## 2023-12-13 ENCOUNTER — Ambulatory Visit
Admission: RE | Admit: 2023-12-13 | Discharge: 2023-12-13 | Disposition: A | Discharge status: Home / Self Care | Source: Ambulatory Visit | Attending: Student in an Organized Health Care Education/Training Program | Admitting: Student in an Organized Health Care Education/Training Program

## 2023-12-13 ENCOUNTER — Ambulatory Visit
Payer: Medicaid Other | Attending: Student in an Organized Health Care Education/Training Program | Admitting: Student in an Organized Health Care Education/Training Program

## 2023-12-13 VITALS — BP 116/87 | HR 80 | Temp 97.6°F | Resp 16 | Ht 61.0 in | Wt 160.0 lb

## 2023-12-13 DIAGNOSIS — M5416 Radiculopathy, lumbar region: Secondary | ICD-10-CM | POA: Insufficient documentation

## 2023-12-13 DIAGNOSIS — M5412 Radiculopathy, cervical region: Secondary | ICD-10-CM | POA: Diagnosis not present

## 2023-12-13 MED ORDER — IOHEXOL 180 MG/ML  SOLN
10.0000 mL | Freq: Once | INTRAMUSCULAR | Status: AC
  Administered 2023-12-13: 10 mL via EPIDURAL
  Filled 2023-12-13: qty 20

## 2023-12-13 MED ORDER — LACTATED RINGERS IV SOLN
Freq: Once | INTRAVENOUS | Status: AC
Start: 2023-12-13 — End: 2023-12-13

## 2023-12-13 MED ORDER — SODIUM CHLORIDE 0.9% FLUSH
1.0000 mL | Freq: Once | INTRAVENOUS | Status: AC
Start: 2023-12-13 — End: 2023-12-13
  Administered 2023-12-13: 1 mL

## 2023-12-13 MED ORDER — LIDOCAINE HCL 2 % IJ SOLN
20.0000 mL | Freq: Once | INTRAMUSCULAR | Status: AC
  Administered 2023-12-13: 100 mg
  Filled 2023-12-13: qty 40

## 2023-12-13 MED ORDER — MIDAZOLAM HCL 2 MG/2ML IJ SOLN
0.5000 mg | Freq: Once | INTRAMUSCULAR | Status: AC
  Administered 2023-12-13: 2 mg via INTRAVENOUS
  Filled 2023-12-13: qty 2

## 2023-12-13 MED ORDER — ROPIVACAINE HCL 2 MG/ML IJ SOLN
1.0000 mL | Freq: Once | INTRAMUSCULAR | Status: AC
  Administered 2023-12-13: 1 mL via EPIDURAL
  Filled 2023-12-13: qty 20

## 2023-12-13 MED ORDER — DEXAMETHASONE SODIUM PHOSPHATE 10 MG/ML IJ SOLN
10.0000 mg | Freq: Once | INTRAMUSCULAR | Status: AC
Start: 2023-12-13 — End: 2023-12-13
  Administered 2023-12-13: 10 mg
  Filled 2023-12-13: qty 1

## 2023-12-13 NOTE — Patient Instructions (Signed)

## 2023-12-13 NOTE — Progress Notes (Signed)
 Safety precautions to be maintained throughout the outpatient stay will include: orient to surroundings, keep bed in low position, maintain call bell within reach at all times, provide assistance with transfer out of bed and ambulation.

## 2023-12-13 NOTE — Progress Notes (Signed)
 PROVIDER NOTE: Interpretation of information contained herein should be left to medically-trained personnel. Specific patient instructions are provided elsewhere under "Patient Instructions" section of medical record. This document was created in part using STT-dictation technology, any transcriptional errors that may result from this process are unintentional.  Patient: Marilyn Hendricks Type: Established DOB: October 11, 1975 MRN: [id number] PCP: Alm Bustard, NP  Service: Procedure DOS: 12/13/2023 Setting: Ambulatory Location: Ambulatory outpatient facility Delivery: Face-to-face Provider: Edward Jolly, MD Specialty: Interventional Pain Management Specialty designation: 09 Location: Outpatient facility Ref. Prov.: Fields, Lisabeth Pick, NP       Interventional Therapy   Procedure: Cervical Epidural Steroid injection (CESI) (Interlaminar) #1  Laterality: Left  Level: C7-T1 Imaging: Fluoroscopy-assisted DOS: 12/13/2023  Performed by: Edward Jolly, MD Anesthesia: Local anesthesia (1-2% Lidocaine) Sedation: Minimal Sedation                         Purpose: Diagnostic/Therapeutic Indications: Cervicalgia, cervical radicular pain, degenerative disc disease, severe enough to impact quality of life or function. Cervical Radicular Pain  NAS-11 score:   Pre-procedure: 8 /10   Post-procedure: 0-No pain/10      Position  Prep  Materials:  Location setting: Procedure suite Position: Prone, on modified reverse trendelenburg to facilitate breathing, with head in head-cradle. Pillows positioned under chest (below chin-level) with cervical spine flexed. Safety Precautions: Patient was assessed for positional comfort and pressure points before starting the procedure. Prepping solution: DuraPrep (Iodine Povacrylex [0.7% available iodine] and Isopropyl Alcohol, 74% w/w) Prep Area: Entire  cervicothoracic region Approach: percutaneous, paramedial Intended target: Posterior cervical epidural  space Materials Procedure:  Tray: Epidural Needle(s): Epidural (Tuohy) Qty: 1 Length: (90mm) 3.5-inch Gauge: 22G   H&P (Pre-op Assessment):  Marilyn Hendricks is a 48 y.o. (year old), female patient, seen today for interventional treatment. She  has a past surgical history that includes Tubal ligation and Cholecystectomy. Marilyn Hendricks has a current medication list which includes the following prescription(s): acetaminophen, albuterol, benzonatate, cyanocobalamin, fluticasone, hydroxyzine, ibuprofen, ipratropium, promethazine-dextromethorphan, semaglutide-weight management, and aerochamber mv. Her primarily concern today is the Neck Pain  Initial Vital Signs:  Pulse/HCG Rate: 80ECG Heart Rate: 76 Temp: (!) 97.2 F (36.2 C) Resp: 18 BP: 112/89 SpO2: 100 %  BMI: Estimated body mass index is 30.23 kg/m as calculated from the following:   Height as of this encounter: 5\' 1"  (1.549 m).   Weight as of this encounter: 160 lb (72.6 kg).  Risk Assessment: Allergies: Reviewed. She is allergic to doxycycline, beeswax, carrot [daucus carota], cats claw [uncaria tomentosa (cats claw)], erythromycin, and prednisone.  Allergy Precautions: None required Coagulopathies: Reviewed. None identified.  Blood-thinner therapy: None at this time Active Infection(s): Reviewed. None identified. Marilyn Hendricks is afebrile  Site Confirmation: Marilyn Hendricks was asked to confirm the procedure and laterality before marking the site Procedure checklist: Completed Consent: Before the procedure and under the influence of no sedative(s), amnesic(s), or anxiolytics, the patient was informed of the treatment options, risks and possible complications. To fulfill our ethical and legal obligations, as recommended by the American Medical Association's Code of Ethics, I have informed the patient of my clinical impression; the nature and purpose of the treatment or procedure; the risks, benefits, and possible complications of the intervention;  the alternatives, including doing nothing; the risk(s) and benefit(s) of the alternative treatment(s) or procedure(s); and the risk(s) and benefit(s) of doing nothing. The patient was provided information about the general risks and possible complications associated with the procedure. These  may include, but are not limited to: failure to achieve desired goals, infection, bleeding, organ or nerve damage, allergic reactions, paralysis, and death. In addition, the patient was informed of those risks and complications associated to Spine-related procedures, such as failure to decrease pain; infection (i.e.: Meningitis, epidural or intraspinal abscess); bleeding (i.e.: epidural hematoma, subarachnoid hemorrhage, or any other type of intraspinal or peri-dural bleeding); organ or nerve damage (i.e.: Any type of peripheral nerve, nerve root, or spinal cord injury) with subsequent damage to sensory, motor, and/or autonomic systems, resulting in permanent pain, numbness, and/or weakness of one or several areas of the body; allergic reactions; (i.e.: anaphylactic reaction); and/or death. Furthermore, the patient was informed of those risks and complications associated with the medications. These include, but are not limited to: allergic reactions (i.e.: anaphylactic or anaphylactoid reaction(s)); adrenal axis suppression; blood sugar elevation that in diabetics may result in ketoacidosis or comma; water retention that in patients with history of congestive heart failure may result in shortness of breath, pulmonary edema, and decompensation with resultant heart failure; weight gain; swelling or edema; medication-induced neural toxicity; particulate matter embolism and blood vessel occlusion with resultant organ, and/or nervous system infarction; and/or aseptic necrosis of one or more joints. Finally, the patient was informed that Medicine is not an exact science; therefore, there is also the possibility of unforeseen or  unpredictable risks and/or possible complications that may result in a catastrophic outcome. The patient indicated having understood very clearly. We have given the patient no guarantees and we have made no promises. Enough time was given to the patient to ask questions, all of which were answered to the patient's satisfaction. Ms. Motter has indicated that she wanted to continue with the procedure. Attestation: I, the ordering provider, attest that I have discussed with the patient the benefits, risks, side-effects, alternatives, likelihood of achieving goals, and potential problems during recovery for the procedure that I have provided informed consent. Date  Time: 12/13/2023  9:27 AM   Pre-Procedure Preparation:  Monitoring: As per clinic protocol. Respiration, ETCO2, SpO2, BP, heart rate and rhythm monitor placed and checked for adequate function Safety Precautions: Patient was assessed for positional comfort and pressure points before starting the procedure. Time-out: I initiated and conducted the "Time-out" before starting the procedure, as per protocol. The patient was asked to participate by confirming the accuracy of the "Time Out" information. Verification of the correct person, site, and procedure were performed and confirmed by me, the nursing staff, and the patient. "Time-out" conducted as per Joint Commission's Universal Protocol (UP.01.01.01). Time: 1020 Start Time: 1020 hrs.  Description  Narrative of Procedure:          Rationale (medical necessity): procedure needed and proper for the diagnosis and/or treatment of the patient's medical symptoms and needs. Start Time: 1020 hrs. Safety Precautions: Aspiration looking for blood return was conducted prior to all injections. At no point did we inject any substances, as a needle was being advanced. No attempts were made at seeking any paresthesias. Safe injection practices and needle disposal techniques used. Medications properly checked  for expiration dates. SDV (single dose vial) medications used. Description of procedure: Protocol guidelines were followed. The patient was assisted into a comfortable position. The target area was identified and the area prepped in the usual manner. Skin & deeper tissues infiltrated with local anesthetic. Appropriate amount of time allowed to pass for local anesthetics to take effect. Using fluoroscopic guidance, the epidural needle was introduced through the skin, ipsilateral to the  reported pain, and advanced to the target area. Posterior laminar os was contacted and the needle walked caudad, until the lamina was cleared. The ligamentum flavum was engaged and the epidural space identified using "loss-of-resistance technique" with 2-3 ml of PF-NaCl (0.9% NSS), in a 5cc dedicated LOR syringe. (See "Imaging guidance" below for use of contrast details.) Once proper needle placement was secured, and negative aspiration confirmed, the solution was injected in intermittent fashion, asking for systemic symptoms every 0.5cc. The needles were then removed and the area cleansed, making sure to leave some of the prepping solution back to take advantage of its long term bactericidal properties.  Vitals:   12/13/23 1015 12/13/23 1021 12/13/23 1024 12/13/23 1030  BP: 95/69 104/70 106/72 116/87  Pulse:      Resp: 18 17 18 16   Temp:    97.6 F (36.4 C)  TempSrc:    Tympanic  SpO2: 97% 98% 95% 100%  Weight:      Height:         End Time: 1024 hrs.  Imaging Guidance (Spinal):          Type of Imaging Technique: Fluoroscopy Guidance (Spinal) Indication(s): Fluoroscopy guidance for needle placement to enhance accuracy in procedures requiring precise needle localization for targeted delivery of medication in or near specific anatomical locations not easily accessible without such real-time imaging assistance. Exposure Time: Please see nurses notes. Contrast: Before injecting any contrast, we confirmed that the  patient did not have an allergy to iodine, shellfish, or radiological contrast. Once satisfactory needle placement was completed at the desired level, radiological contrast was injected. Contrast injected under live fluoroscopy. No contrast complications. See chart for type and volume of contrast used. Fluoroscopic Guidance: I was personally present during the use of fluoroscopy. "Tunnel Vision Technique" used to obtain the best possible view of the target area. Parallax error corrected before commencing the procedure. "Direction-depth-direction" technique used to introduce the needle under continuous pulsed fluoroscopy. Once target was reached, antero-posterior, oblique, and lateral fluoroscopic projection used confirm needle placement in all planes. Images permanently stored in EMR. Interpretation: I personally interpreted the imaging intraoperatively. Adequate needle placement confirmed in multiple planes. Appropriate spread of contrast into desired area was observed. No evidence of afferent or efferent intravascular uptake. No intrathecal or subarachnoid spread observed. Permanent images saved into the patient's record.  Post-operative Assessment:  Post-procedure Vital Signs:  Pulse/HCG Rate: 8075 Temp: 97.6 F (36.4 C) Resp: 16 BP: 116/87 SpO2: 100 %  EBL: None  Complications: No immediate post-treatment complications observed by team, or reported by patient.  Note: The patient tolerated the entire procedure well. A repeat set of vitals were taken after the procedure and the patient was kept under observation following institutional policy, for this type of procedure. Post-procedural neurological assessment was performed, showing return to baseline, prior to discharge. The patient was provided with post-procedure discharge instructions, including a section on how to identify potential problems. Should any problems arise concerning this procedure, the patient was given instructions to  immediately contact us, at any time, without hesitation. In any case, we plan to contact the patient by telephone for a follow-up status report regarding this interventional procedure.  Comments:  No additional relevant information.  Plan of Care (POC)  Orders:  Orders Placed This Encounter  Procedures   Lumbar Transforaminal Epidural    Standing Status:   Future    Expected Date:   01/05/2024    Expiration Date:   03/14/2024    Scheduling  Instructions:     Laterality: Left L4 and L5        Sedation: IV Versed     Timeframe: ASAP    Where will this procedure be performed?:   [facility]   DG [facility] C-ARM 1-60 MIN NO REPORT    Intraoperative interpretation by procedural physician at [facility].    Standing Status:   Standing    Number of Occurrences:   1    Reason for exam::   Assistance in needle guidance and placement for procedures requiring needle placement in or near specific anatomical locations not easily accessible without such assistance.    Patient is having increased left low back pain that radiates into her posterior thigh to her left calf along with spasms Discussed Left L4, L5 TF-ESI in 3-4 weeks   Orders Placed This Encounter  Procedures   Lumbar Transforaminal Epidural    Standing Status:   Future    Expected Date:   01/05/2024    Expiration Date:   03/14/2024    Scheduling Instructions:     Laterality: Left L4 and L5        Sedation: IV Versed     Timeframe: ASAP    Where will this procedure be performed?:   [facility]   DG PAIN [facility] C-ARM 1-60 MIN NO REPORT    Intraoperative interpretation by procedural physician at [facility].    Standing Status:   Standing    Number of Occurrences:   1    Reason for exam::   Assistance in needle guidance and placement for procedures requiring needle placement in or near specific anatomical locations not easily accessible without such assistance.      Medications ordered for  procedure: Meds ordered this encounter  Medications   iohexol (OMNIPAQUE) 180 MG/ML injection 10 mL    Must be Myelogram-compatible. If not available, you may substitute with a water-soluble, non-ionic, hypoallergenic, myelogram-compatible radiological contrast medium.   lidocaine (XYLOCAINE) 2 % (with pres) injection 400 mg   lactated ringers infusion   midazolam (VERSED) injection 0.5-2 mg    Make sure Flumazenil is available in the pyxis when using this medication. If oversedation occurs, administer 0.2 mg IV over 15 sec. If after 45 sec no response, administer 0.2 mg again over 1 min; may repeat at 1 min intervals; not to exceed 4 doses (1 mg)   sodium chloride flush (NS) 0.9 % injection 1 mL   ropivacaine (PF) 2 mg/mL (0.2%) (NAROPIN) injection 1 mL   dexamethasone (DECADRON) injection 10 mg   Medications administered: We administered iohexol, lidocaine, lactated ringers, midazolam, sodium chloride flush, ropivacaine (PF) 2 mg/mL (0.2%), and dexamethasone.  See the medical record for exact dosing, route, and time of administration.  Follow-up plan:   Return in about 23 days (around 01/05/2024) for Left L4, L5 TF- ESI , in clinic IV Versed.       BL L3-L5 RFA 09/27/23, C-ESI 12/13/23     Recent Visits Date Type Provider Dept  11/16/23 Office Visit Edward Jolly, MD [facility]  09/27/23 Procedure visit Edward Jolly, MD [facility]  Showing recent visits within past 90 days and meeting all other requirements Today's Visits Date Type Provider Dept  12/13/23 Procedure visit Edward Jolly, MD [facility]  Showing today's visits and meeting all other requirements Future Appointments No visits were found meeting these conditions. Showing future appointments within next 90 days and meeting all other requirements  Disposition: Discharge home  Discharge (Date  Time): 12/13/2023; 1033 hrs.   Primary Care Physician: Alm Bustard, NP Location: [facility]  Outpatient [facility] Facility Note by: Edward Jolly, MD (TTS technology used. I apologize for any typographical errors that were not detected and corrected.) Date: 12/13/2023; Time: 11:21 AM  Disclaimer:  Medicine is not an Visual merchandiser. The only guarantee in medicine is that nothing is guaranteed. It is important to note that the decision to proceed with this intervention was based on the information collected from the patient. The Data and conclusions were drawn from the patient's questionnaire, the interview, and the physical examination. Because the information was provided in large part by the patient, it cannot be guaranteed that it has not been purposely or unconsciously manipulated. Every effort has been made to obtain as much relevant data as possible for this evaluation. It is important to note that the conclusions that lead to this procedure are derived in large part from the available data. Always take into account that the treatment will also be dependent on availability of resources and existing treatment guidelines, considered by other Pain Management Practitioners as being common knowledge and practice, at the time of the intervention. For Medico-Legal purposes, it is also important to point out that variation in procedural techniques and pharmacological choices are the acceptable norm. The indications, contraindications, technique, and results of the above procedure should only be interpreted and judged by a Board-Certified Interventional Pain Specialist with extensive familiarity and expertise in the same exact procedure and technique.

## 2023-12-14 ENCOUNTER — Telehealth: Payer: Self-pay

## 2023-12-14 NOTE — Telephone Encounter (Signed)
 No issues post-procedure.

## 2024-01-10 ENCOUNTER — Ambulatory Visit
Attending: Student in an Organized Health Care Education/Training Program | Admitting: Student in an Organized Health Care Education/Training Program

## 2024-01-17 ENCOUNTER — Ambulatory Visit
Admission: RE | Admit: 2024-01-17 | Discharge: 2024-01-17 | Disposition: A | Discharge status: Home / Self Care | Source: Ambulatory Visit | Attending: Student in an Organized Health Care Education/Training Program | Admitting: Student in an Organized Health Care Education/Training Program

## 2024-01-17 ENCOUNTER — Encounter: Payer: Self-pay | Admitting: Student in an Organized Health Care Education/Training Program

## 2024-01-17 ENCOUNTER — Ambulatory Visit
Attending: Student in an Organized Health Care Education/Training Program | Admitting: Student in an Organized Health Care Education/Training Program

## 2024-01-17 VITALS — BP 119/86 | HR 77 | Temp 97.4°F | Resp 19 | Ht 61.0 in | Wt 160.0 lb

## 2024-01-17 DIAGNOSIS — M5416 Radiculopathy, lumbar region: Secondary | ICD-10-CM | POA: Diagnosis not present

## 2024-01-17 DIAGNOSIS — M7918 Myalgia, other site: Secondary | ICD-10-CM | POA: Insufficient documentation

## 2024-01-17 DIAGNOSIS — G894 Chronic pain syndrome: Secondary | ICD-10-CM | POA: Insufficient documentation

## 2024-01-17 MED ORDER — ROPIVACAINE HCL 2 MG/ML IJ SOLN
2.0000 mL | Freq: Once | INTRAMUSCULAR | Status: AC
  Administered 2024-01-17: 20 mL via EPIDURAL
  Filled 2024-01-17: qty 20

## 2024-01-17 MED ORDER — MIDAZOLAM HCL 2 MG/2ML IJ SOLN
0.5000 mg | Freq: Once | INTRAMUSCULAR | Status: AC
  Administered 2024-01-17: 2 mg via INTRAVENOUS
  Filled 2024-01-17: qty 2

## 2024-01-17 MED ORDER — SODIUM CHLORIDE 0.9% FLUSH
2.0000 mL | Freq: Once | INTRAVENOUS | Status: AC
  Administered 2024-01-17: 10 mL

## 2024-01-17 MED ORDER — IOHEXOL 180 MG/ML  SOLN
10.0000 mL | Freq: Once | INTRAMUSCULAR | Status: AC
  Administered 2024-01-17: 10 mL via EPIDURAL
  Filled 2024-01-17: qty 20

## 2024-01-17 MED ORDER — LIDOCAINE HCL 2 % IJ SOLN
20.0000 mL | Freq: Once | INTRAMUSCULAR | Status: AC
  Administered 2024-01-17: 400 mg
  Filled 2024-01-17: qty 20

## 2024-01-17 MED ORDER — DEXAMETHASONE SODIUM PHOSPHATE 10 MG/ML IJ SOLN
20.0000 mg | Freq: Once | INTRAMUSCULAR | Status: AC
  Administered 2024-01-17: 10 mg
  Filled 2024-01-17: qty 2

## 2024-01-17 NOTE — Progress Notes (Signed)
 Safety precautions to be maintained throughout the outpatient stay will include: orient to surroundings, keep bed in low position, maintain call bell within reach at all times, provide assistance with transfer out of bed and ambulation.

## 2024-01-17 NOTE — Patient Instructions (Signed)

## 2024-01-17 NOTE — Progress Notes (Signed)
 PROVIDER NOTE: Interpretation of information contained herein should be left to medically-trained personnel. Specific patient instructions are provided elsewhere under "Patient Instructions" section of medical record. This document was created in part using STT-dictation technology, any transcriptional errors that may result from this process are unintentional.  Patient: Marilyn Hendricks Type: Established DOB: Feb 02, 1976 MRN: [id number] PCP: Will Hare, NP  Service: Procedure DOS: 01/17/2024 Setting: Ambulatory Location: Ambulatory outpatient facility Delivery: Face-to-face Provider: Cephus Collin, MD Specialty: Interventional Pain Management Specialty designation: 09 Location: Outpatient facility Ref. Prov.: Fields, Daylene Evangelist, NP       Interventional Therapy   Procedure: Lumbar trans-foraminal epidural steroid injection (L-TFESI) #1  Laterality: Right (-RT)  Level: L4 and L5 nerve root(s) Imaging: Fluoroscopy-guided         Anesthesia: Local anesthesia (1-2% Lidocaine ) Sedation: Minimal Sedation                       DOS: 01/17/2024  Performed by: Cephus Collin, MD  Purpose: Diagnostic/Therapeutic Indications: Lumbar radicular pain severe enough to impact quality of life or function. 1. Lumbar radicular pain   2. Chronic pain syndrome   3. Myofascial pain syndrome of lumbar spine    NAS-11 Pain score:   Pre-procedure: 7 /10   Post-procedure: 4 /10     Position / Prep / Materials:  Position: Prone  Prep solution: ChloraPrep (2% chlorhexidine gluconate and 70% isopropyl alcohol) Prep Area: Entire Posterior Lumbosacral Area.  From the lower tip of the scapula down to the tailbone and from flank to flank. Materials:  Tray: Block Needle(s):  Type: Spinal  Gauge (G): 22  Length: 5-in  Qty: 2     H&P (Pre-op Assessment):  Marilyn Hendricks is a 47 y.o. (year old), female patient, seen today for interventional treatment. She  has a past surgical history that includes Tubal ligation  and Cholecystectomy. Marilyn Hendricks has a current medication list which includes the following prescription(s): acetaminophen , albuterol , benzonatate , cyanocobalamin, fluticasone, ibuprofen , ipratropium, ketorolac , promethazine -dextromethorphan, semaglutide-weight management, and aerochamber mv. Her primarily concern today is the Back Pain (Lower back and neck)  Initial Vital Signs:  Pulse/HCG Rate: 90ECG Heart Rate: 77 Temp: (!) 97.4 F (36.3 C) Resp: 20 BP: 114/84 SpO2: 100 %  BMI: Estimated body mass index is 30.23 kg/m as calculated from the following:   Height as of this encounter: 5\' 1"  (1.549 m).   Weight as of this encounter: 160 lb (72.6 kg).  Risk Assessment: Allergies: Reviewed. She is allergic to doxycycline , beeswax, carrot [daucus carota], cats claw [uncaria tomentosa (cats claw)], erythromycin, and prednisone .  Allergy Precautions: None required Coagulopathies: Reviewed. None identified.  Blood-thinner therapy: None at this time Active Infection(s): Reviewed. None identified. Marilyn Hendricks is afebrile  Site Confirmation: Marilyn Hendricks was asked to confirm the procedure and laterality before marking the site Procedure checklist: Completed Consent: Before the procedure and under the influence of no sedative(s), amnesic(s), or anxiolytics, the patient was informed of the treatment options, risks and possible complications. To fulfill our ethical and legal obligations, as recommended by the American Medical Association's Code of Ethics, I have informed the patient of my clinical impression; the nature and purpose of the treatment or procedure; the risks, benefits, and possible complications of the intervention; the alternatives, including doing nothing; the risk(s) and benefit(s) of the alternative treatment(s) or procedure(s); and the risk(s) and benefit(s) of doing nothing. The patient was provided information about the general risks and possible complications associated with the  procedure.  These may include, but are not limited to: failure to achieve desired goals, infection, bleeding, organ or nerve damage, allergic reactions, paralysis, and death. In addition, the patient was informed of those risks and complications associated to Spine-related procedures, such as failure to decrease pain; infection (i.e.: Meningitis, epidural or intraspinal abscess); bleeding (i.e.: epidural hematoma, subarachnoid hemorrhage, or any other type of intraspinal or peri-dural bleeding); organ or nerve damage (i.e.: Any type of peripheral nerve, nerve root, or spinal cord injury) with subsequent damage to sensory, motor, and/or autonomic systems, resulting in permanent pain, numbness, and/or weakness of one or several areas of the body; allergic reactions; (i.e.: anaphylactic reaction); and/or death. Furthermore, the patient was informed of those risks and complications associated with the medications. These include, but are not limited to: allergic reactions (i.e.: anaphylactic or anaphylactoid reaction(s)); adrenal axis suppression; blood sugar elevation that in diabetics may result in ketoacidosis or comma; water retention that in patients with history of congestive heart failure may result in shortness of breath, pulmonary edema, and decompensation with resultant heart failure; weight gain; swelling or edema; medication-induced neural toxicity; particulate matter embolism and blood vessel occlusion with resultant organ, and/or nervous system infarction; and/or aseptic necrosis of one or more joints. Finally, the patient was informed that Medicine is not an exact science; therefore, there is also the possibility of unforeseen or unpredictable risks and/or possible complications that may result in a catastrophic outcome. The patient indicated having understood very clearly. We have given the patient no guarantees and we have made no promises. Enough time was given to the patient to ask questions, all of  which were answered to the patient's satisfaction. Marilyn Hendricks has indicated that she wanted to continue with the procedure. Attestation: I, the ordering provider, attest that I have discussed with the patient the benefits, risks, side-effects, alternatives, likelihood of achieving goals, and potential problems during recovery for the procedure that I have provided informed consent. Date  Time: 01/17/2024  2:21 PM  Pre-Procedure Preparation:  Monitoring: As per clinic protocol. Respiration, ETCO2, SpO2, BP, heart rate and rhythm monitor placed and checked for adequate function Safety Precautions: Patient was assessed for positional comfort and pressure points before starting the procedure. Time-out: I initiated and conducted the "Time-out" before starting the procedure, as per protocol. The patient was asked to participate by confirming the accuracy of the "Time Out" information. Verification of the correct person, site, and procedure were performed and confirmed by me, the nursing staff, and the patient. "Time-out" conducted as per Joint Commission's Universal Protocol (UP.01.01.01). Time: 1539 Start Time: 1538 hrs.  Description/Narrative of Procedure:          Target: The 6 o'clock position under the pedicle, on the affected side. Region: Posterolateral Lumbosacral Approach: Posterior Percutaneous Paravertebral approach.  Rationale (medical necessity): procedure needed and proper for the diagnosis and/or treatment of the patient's medical symptoms and needs. Procedural Technique Safety Precautions: Aspiration looking for blood return was conducted prior to all injections. At no point did we inject any substances, as a needle was being advanced. No attempts were made at seeking any paresthesias. Safe injection practices and needle disposal techniques used. Medications properly checked for expiration dates. SDV (single dose vial) medications used. Description of the Procedure: Protocol guidelines  were followed. The patient was placed in position over the procedure table. The target area was identified and the area prepped in the usual manner. Skin & deeper tissues infiltrated with local anesthetic. Appropriate amount of time allowed to pass  for local anesthetics to take effect. The procedure needles were then advanced to the target area. Proper needle placement secured. Negative aspiration confirmed. Solution injected in intermittent fashion, asking for systemic symptoms every 0.5cc of injectate. The needles were then removed and the area cleansed, making sure to leave some of the prepping solution back to take advantage of its long term bactericidal properties.  Vitals:   01/17/24 1540 01/17/24 1544 01/17/24 1547 01/17/24 1553  BP: (!) 128/97 (!) 114/96 (!) 124/91 119/86  Pulse: 87 76 77   Resp: 20 14 19    Temp:      SpO2: 95% 98% 96% 100%  Weight:      Height:        Start Time: 1538 hrs. End Time: 1547 hrs.  Imaging Guidance (Spinal):          Type of Imaging Technique: Fluoroscopy Guidance (Spinal) Indication(s): Fluoroscopy guidance for needle placement to enhance accuracy in procedures requiring precise needle localization for targeted delivery of medication in or near specific anatomical locations not easily accessible without such real-time imaging assistance. Exposure Time: Please see nurses notes. Contrast: Before injecting any contrast, we confirmed that the patient did not have an allergy to iodine, shellfish, or radiological contrast. Once satisfactory needle placement was completed at the desired level, radiological contrast was injected. Contrast injected under live fluoroscopy. No contrast complications. See chart for type and volume of contrast used. Fluoroscopic Guidance: I was personally present during the use of fluoroscopy. "Tunnel Vision Technique" used to obtain the best possible view of the target area. Parallax error corrected before commencing the procedure.  "Direction-depth-direction" technique used to introduce the needle under continuous pulsed fluoroscopy. Once target was reached, antero-posterior, oblique, and lateral fluoroscopic projection used confirm needle placement in all planes. Images permanently stored in EMR. Interpretation: I personally interpreted the imaging intraoperatively. Adequate needle placement confirmed in multiple planes. Appropriate spread of contrast into desired area was observed. No evidence of afferent or efferent intravascular uptake. No intrathecal or subarachnoid spread observed. Permanent images saved into the patient's record.  Post-operative Assessment:  Post-procedure Vital Signs:  Pulse/HCG Rate: 7777 Temp: (!) 97.4 F (36.3 C) Resp: 19 BP: 119/86 SpO2: 100 %  EBL: None  Complications: No immediate post-treatment complications observed by team, or reported by patient.  Note: The patient tolerated the entire procedure well. A repeat set of vitals were taken after the procedure and the patient was kept under observation following institutional policy, for this type of procedure. Post-procedural neurological assessment was performed, showing return to baseline, prior to discharge. The patient was provided with post-procedure discharge instructions, including a section on how to identify potential problems. Should any problems arise concerning this procedure, the patient was given instructions to immediately contact us , at any time, without hesitation. In any case, we plan to contact the patient by telephone for a follow-up status report regarding this interventional procedure.  Comments:  No additional relevant information.  Plan of Care (POC)  Orders:  Orders Placed This Encounter  Procedures   DG PAIN CLINIC C-ARM 1-60 MIN NO REPORT    Intraoperative interpretation by procedural physician at [facility].    Standing Status:   Standing    Number of Occurrences:   1    Reason for exam::    Assistance in needle guidance and placement for procedures requiring needle placement in or near specific anatomical locations not easily accessible without such assistance.     Medications ordered for procedure: Meds ordered  this encounter  Medications   iohexol  (OMNIPAQUE ) 180 MG/ML injection 10 mL    Must be Myelogram-compatible. If not available, you may substitute with a water-soluble, non-ionic, hypoallergenic, myelogram-compatible radiological contrast medium.   lidocaine  (XYLOCAINE ) 2 % (with pres) injection 400 mg   midazolam  (VERSED ) injection 0.5-2 mg    Make sure Flumazenil is available in the pyxis when using this medication. If oversedation occurs, administer 0.2 mg IV over 15 sec. If after 45 sec no response, administer 0.2 mg again over 1 min; may repeat at 1 min intervals; not to exceed 4 doses (1 mg)   sodium chloride  flush (NS) 0.9 % injection 2 mL    This is for a two (2) level block. Use two (2) syringes and divide content in half.   ropivacaine  (PF) 2 mg/mL (0.2%) (NAROPIN ) injection 2 mL    This is for a two (2) level block. Use two (2) syringes and divide content in half.   dexamethasone  (DECADRON ) injection 20 mg    This is for a two (2) level block. Use two (2) syringes and divide content in half.   Medications administered: We administered iohexol , lidocaine , midazolam , sodium chloride  flush, ropivacaine  (PF) 2 mg/mL (0.2%), and dexamethasone .  See the medical record for exact dosing, route, and time of administration.  Follow-up plan:   Return in about 4 weeks (around 02/14/2024) for PPE, F2F.       BL L3-L5 RFA 09/27/23: 100% pain relief for 4.5 months, pain returing, C-ESI 12/13/23-not helpful, right L4, L5 transforaminal ESI 01/17/2024    Recent Visits Date Type Provider Dept  12/13/23 Procedure visit Cephus Collin, MD [facility]  11/16/23 Office Visit Cephus Collin, MD [facility]  Showing recent visits within past 90 days and  meeting all other requirements Today's Visits Date Type Provider Dept  01/17/24 Procedure visit Cephus Collin, MD [facility]  Showing today's visits and meeting all other requirements Future Appointments Date Type Provider Dept  02/14/24 Appointment Cephus Collin, MD [facility]  Showing future appointments within next 90 days and meeting all other requirements  Disposition: Discharge home  Discharge (Date  Time): 01/17/2024; 1557 hrs.   Primary Care Physician: Will Hare, NP Location: [facility] Note by: Cephus Collin, MD (TTS technology used. I apologize for any typographical errors that were not detected and corrected.) Date: 01/17/2024; Time: 4:31 PM  Disclaimer:  Medicine is not an Visual merchandiser. The only guarantee in medicine is that nothing is guaranteed. It is important to note that the decision to proceed with this intervention was based on the information collected from the patient. The Data and conclusions were drawn from the patient's questionnaire, the interview, and the physical examination. Because the information was provided in large part by the patient, it cannot be guaranteed that it has not been purposely or unconsciously manipulated. Every effort has been made to obtain as much relevant data as possible for this evaluation. It is important to note that the conclusions that lead to this procedure are derived in large part from the available data. Always take into account that the treatment will also be dependent on availability of resources and existing treatment guidelines, considered by other Pain Management Practitioners as being common knowledge and practice, at the time of the intervention. For Medico-Legal purposes, it is also important to point out that variation in procedural techniques and pharmacological choices are the acceptable norm. The indications, contraindications, technique, and results of the  above  procedure should only be interpreted and judged by a Board-Certified Interventional Pain Specialist with extensive familiarity and expertise in the same exact procedure and technique.

## 2024-01-18 ENCOUNTER — Telehealth: Payer: Self-pay

## 2024-01-18 NOTE — Telephone Encounter (Signed)
 Phone number not in service. Attempt to contact for post-procedure follow-up.

## 2024-01-19 ENCOUNTER — Ambulatory Visit
Admission: EM | Admit: 2024-01-19 | Discharge: 2024-01-19 | Disposition: A | Discharge status: Home / Self Care | Attending: Emergency Medicine | Admitting: Emergency Medicine

## 2024-01-19 DIAGNOSIS — H66001 Acute suppurative otitis media without spontaneous rupture of ear drum, right ear: Secondary | ICD-10-CM

## 2024-01-19 MED ORDER — AMOXICILLIN-POT CLAVULANATE 875-125 MG PO TABS: 1.0000 | ORAL_TABLET | Freq: Two times a day (BID) | ORAL | 0 refills | Status: AC

## 2024-01-19 NOTE — Discharge Instructions (Signed)
 Take the Augmentin twice daily for 7 days with food for treatment of your ear infection.  Take an over-the-counter probiotic 1 hour after each dose of antibiotic to prevent diarrhea.  Use over-the-counter Tylenol and ibuprofen as needed for pain or fever.  Place a hot water bottle, or heating pad, underneath your pillowcase at night to help dilate up your ear and aid in pain relief as well as resolution of the infection.  Return for reevaluation for any new or worsening symptoms.

## 2024-01-19 NOTE — ED Triage Notes (Addendum)
 Patient presents to [facility] for right ear pain and right sided facial pain x 1 day. Took a dose of left over amoxicillin  prescription. Also inserted a cotton swab with vicks on it to help with pain.

## 2024-01-19 NOTE — ED Provider Notes (Signed)
 [facility]    CSN: [id number] Arrival date & time: 01/19/24  0802      History   Chief Complaint Chief Complaint  Patient presents with   Otalgia    HPI Marilyn Hendricks is a 48 y.o. female.   HPI  48 year old female with past medical history significant for chronic pain, eustachian tube dysfunction, and GERD presenting for evaluation of right ear pain and pain on the right side of her face that started 1 day ago.  She reports it woke her up middle the night.  She describes her hearing in her right ear as being muffled and she notes clear drainage from the ear.  She has took Vicks vapor rub as well as hot salt in her ear.  She denies fever, runny nose, or nasal congestion.  Past Medical History:  Diagnosis Date   ETD (eustachian tube dysfunction)    GERD (gastroesophageal reflux disease)     Patient Active Problem List   Diagnosis Date Noted   Lumbar spondylosis 03/02/2023   Cervical facet joint syndrome 03/02/2023   Cervical radicular pain 03/02/2023   Chronic pain syndrome 03/02/2023   Prediabetes 02/17/2023   Lymphocytosis 11/02/2022   Cyst of skin 01/26/2018   Encounter for screening mammogram for breast cancer 01/26/2018   Obstructive chronic bronchitis without exacerbation (HCC) 04/09/2014   Tobacco abuse 04/09/2014   Marijuana abuse 04/09/2014    Past Surgical History:  Procedure Laterality Date   CHOLECYSTECTOMY     TUBAL LIGATION      OB History     Gravida  5   Para  3   Term      Preterm      AB  1   Living  3      SAB  1   IAB      Ectopic      Multiple  3   Live Births  3        Obstetric Comments  1st Menstrual Cycle:  12  1st Pregnancy:  14           Home Medications    Prior to Admission medications   Medication Sig Start Date End Date Taking? Authorizing Provider  amoxicillin -clavulanate (AUGMENTIN ) 875-125 MG tablet Take 1 tablet by mouth every 12 (twelve) hours for 7 days. 01/19/24 01/26/24 Yes  Kent Pear, NP  acetaminophen  (TYLENOL ) 325 MG tablet Take 650 mg by mouth every 6 (six) hours as needed.    [provider]  albuterol  (VENTOLIN  HFA) 108 (90 Base) MCG/ACT inhaler Inhale 2 puffs into the lungs every 4 (four) hours as needed. 11/15/23   Kent Pear, NP  cyanocobalamin (VITAMIN B12) 1000 MCG/ML injection Inject 1,000 mcg into the muscle every 30 (thirty) days. 05/07/23 05/01/24  [provider]  fluticasone (FLOVENT HFA) 110 MCG/ACT inhaler Inhale 1 puff into the lungs 2 (two) times daily. 12/10/23 12/09/24  [provider]  ibuprofen  (ADVIL ) 800 MG tablet Take 1 tablet (800 mg total) by mouth every 8 (eight) hours as needed. 04/24/23   Menshew, Raye Cai, PA-C  Semaglutide-Weight Management 0.25 MG/0.5ML SOAJ Inject 0.25 mg into the skin once a week. 12/10/23   [provider]  Spacer/Aero-Holding Chambers (AEROCHAMBER MV) inhaler Use as instructed 11/15/23   Kent Pear, NP    Family History Family History  Problem Relation Age of Onset   Breast cancer Mother 73       double mastectomy in 1994   Asthma Mother  Esophagitis Father    Cancer Maternal Grandfather        prostate    Social History Social History   Tobacco Use   Smoking status: Every Day    Current packs/day: 0.50    Average packs/day: 0.5 packs/day for 26.0 years (13.0 ttl pk-yrs)    Types: Cigarettes   Smokeless tobacco: Never   Tobacco comments:    Vapes, weed,  Vaping Use   Vaping status: Some Days   Substances: Flavoring  Substance Use Topics   Alcohol use: No   Drug use: Yes    Frequency: 7.0 times per week    Types: Marijuana    Comment: last night used     Allergies   Doxycycline , Beeswax, Carrot [daucus carota], Cats claw [uncaria tomentosa (cats claw)], Erythromycin, and Prednisone    Review of Systems Review of Systems  Constitutional:  Negative for fever.  HENT:  Positive for ear discharge, ear pain and hearing loss. Negative for congestion  and rhinorrhea.      Physical Exam Triage Vital Signs ED Triage Vitals [01/19/24 0813]  Encounter Vitals Group     BP      Systolic BP Percentile      Diastolic BP Percentile      Pulse      Resp      Temp      Temp src      SpO2      Weight      Height      Head Circumference      Peak Flow      Pain Score 8     Pain Loc      Pain Education      Exclude from Growth Chart    No data found.  Updated Vital Signs BP (P) 113/79 (BP Location: Right Arm)   Pulse (P) 75   Temp (P) 97.7 F (36.5 C) (Oral)   Resp (P) 16   LMP 08/08/2021 (Approximate)   Visual Acuity Right Eye Distance:   Left Eye Distance:   Bilateral Distance:    Right Eye Near:   Left Eye Near:    Bilateral Near:     Physical Exam Vitals and nursing note reviewed.  Constitutional:      Appearance: Normal appearance. She is not ill-appearing.  HENT:     Head: Normocephalic and atraumatic.     Right Ear: Ear canal and external ear normal. There is no impacted cerumen.     Ears:     Comments: Right tympanic membrane is erythematous and injected with a loss of landmarks.  EAC is clear. Skin:    General: Skin is warm and dry.     Capillary Refill: Capillary refill takes less than 2 seconds.     Findings: No rash.  Neurological:     General: No focal deficit present.     Mental Status: She is alert and oriented to person, place, and time.      UC Treatments / Results  Labs (all labs ordered are listed, but only abnormal results are displayed) Labs Reviewed - No data to display  EKG   Radiology DG PAIN CLINIC C-ARM 1-60 MIN NO REPORT Result Date: 01/17/2024 Fluoro was used, but no Radiologist interpretation will be provided. Please refer to "NOTES" tab for provider progress note.   Procedures Procedures (including critical care time)  Medications Ordered in UC Medications - No data to display  Initial Impression / Assessment and Plan / UC Course  I have reviewed the triage vital  signs and the nursing notes.  Pertinent labs & imaging results that were available during my care of the patient were reviewed by me and considered in my medical decision making (see chart for details).   Patient is a nontoxic-appearing 48 year old female presenting for evaluation of right ear pain as outlined HPI above.  Her physical exam does reveal erythematous and injected tympanic membrane on the right with a loss of landmarks.  Her EAC is clear.  She has a history of recurrent ear infections and reports that the last time she had an ear infection she was prescribed Augmentin , "because it is the only antibiotic that works" and an unknown eardrop to help with pain.  I offered to treat the patient with cefdinir  and the patient states that she would prefer the Augmentin  because it works for her.  I will discharge her home on a 7-day course of Augmentin .   Final Clinical Impressions(s) / UC Diagnoses   Final diagnoses:  Non-recurrent acute suppurative otitis media of right ear without spontaneous rupture of tympanic membrane     Discharge Instructions      Take the Augmentin  twice daily for 7 days with food for treatment of your ear infection.  Take an over-the-counter probiotic 1 hour after each dose of antibiotic to prevent diarrhea.  Use over-the-counter Tylenol  and ibuprofen  as needed for pain or fever.  Place a hot water bottle, or heating pad, underneath your pillowcase at night to help dilate up your ear and aid in pain relief as well as resolution of the infection.  Return for reevaluation for any new or worsening symptoms.      ED Prescriptions     Medication Sig Dispense Auth. Provider   amoxicillin -clavulanate (AUGMENTIN ) 875-125 MG tablet Take 1 tablet by mouth every 12 (twelve) hours for 7 days. 14 tablet Kent Pear, NP      PDMP not reviewed this encounter.   Kent Pear, NP 01/19/24 [phone]

## 2024-02-14 ENCOUNTER — Ambulatory Visit: Admitting: Student in an Organized Health Care Education/Training Program

## 2024-02-15 ENCOUNTER — Encounter (INDEPENDENT_AMBULATORY_CARE_PROVIDER_SITE_OTHER): Payer: Self-pay

## 2024-02-15 ENCOUNTER — Encounter: Payer: Self-pay | Admitting: Student in an Organized Health Care Education/Training Program

## 2024-02-15 ENCOUNTER — Ambulatory Visit
Attending: Student in an Organized Health Care Education/Training Program | Admitting: Student in an Organized Health Care Education/Training Program

## 2024-02-15 VITALS — BP 123/80 | HR 70 | Temp 97.4°F | Resp 17 | Ht 61.5 in | Wt 157.3 lb

## 2024-02-15 DIAGNOSIS — G894 Chronic pain syndrome: Secondary | ICD-10-CM | POA: Diagnosis present

## 2024-02-15 DIAGNOSIS — M47816 Spondylosis without myelopathy or radiculopathy, lumbar region: Secondary | ICD-10-CM | POA: Insufficient documentation

## 2024-02-15 NOTE — Progress Notes (Signed)
 Safety precautions to be maintained throughout the outpatient stay will include: orient to surroundings, keep bed in low position, maintain call bell within reach at all times, provide assistance with transfer out of bed and ambulation.

## 2024-02-15 NOTE — Patient Instructions (Signed)
Moderate Conscious Sedation, Adult Sedation is the use of medicines to help you relax and not feel pain. Moderate conscious sedation is a type of sedation that makes you less alert than normal. You are still able to respond to instructions, touch, or both. This type of sedation is used during short medical and dental procedures. It is milder than deep sedation, which is a type of sedation you cannot be easily woken up from. It is also milder than general anesthesia, which is the use of medicines to make you fall asleep. Moderate conscious sedation lets you return to your normal activities sooner. Tell a health care provider about: Any allergies you have. All medicines you are taking, including vitamins, herbs, steroids, eye drops, creams, and over-the-counter medicines. Any problems you or family members have had with anesthesia. Any bleeding problems you have. Any surgeries you have had. Any medical conditions you have. Whether you are pregnant or may be pregnant. Any recent alcohol, tobacco, or drug use. What are the risks? Your health care provider will talk with you about risks. These may include: Oversedation. This is when you get too much medicine. Nausea or vomiting. Allergic reaction to medicines. Trouble breathing. If this happens, a breathing tube may be used. It will be removed when you can breathe better on your own. Heart trouble. Lung trouble. Emergence delirium. This is when you feel confused while the sedation wears off. This gets better with time. What happens before the procedure? When to stop eating and drinking Follow instructions from your health care provider about what you may eat and drink. These may include: 8 hours before your procedure Stop eating most foods. Do not eat meat, fried foods, or fatty foods. Eat only light foods, such as toast or crackers. All liquids are okay except energy drinks and alcohol. 6 hours before your procedure Stop eating. Drink only  clear liquids, such as water, clear fruit juice, black coffee, plain tea, and sports drinks. Do not drink energy drinks or alcohol. 2 hours before your procedure Stop drinking all liquids. You may be allowed to take medicines with small sips of water. If you do not follow your health care provider's instructions, your procedure may be delayed or canceled. Medicines Ask your health care provider about: Changing or stopping your regular medicines. These include any diabetes medicines or blood thinners you take. Taking medicines such as aspirin and ibuprofen. These medicines can thin your blood. Do not take them unless your health care provider tells you to. Taking over-the-counter medicines, vitamins, herbs, and supplements. Tests and exams You may have an exam or testing. You may have a blood or urine sample taken. General instructions Do not use any products that contain nicotine or tobacco for at least 4 weeks before the procedure. These products include cigarettes, chewing tobacco, and vaping devices, such as e-cigarettes. If you need help quitting, ask your health care provider. If you will be going home right after the procedure, plan to have a responsible adult: Take you home from the hospital or clinic. You will not be allowed to drive. Care for you for the time you are told. What happens during the procedure?  You will be given the sedative. It may be given: As a pill you can take by mouth. It can also be put into the rectum. As a spray through the nose. As an injection into muscle. As an injection into a vein through an IV. You may be given oxygen as needed. Your blood pressure, heart   rate, breathing rate, and blood oxygen level will be monitored during the procedure. The medical or dental procedure will be done. The procedure may vary among health care providers and hospitals. What happens after the procedure? Your blood pressure, heart rate, breathing rate, and blood oxygen  level will be monitored until you leave the hospital or clinic. You will get fluids through an IV as needed. Do not drive or operate machinery until your health care provider says that it is safe. This information is not intended to replace advice given to you by your health care provider. Make sure you discuss any questions you have with your health care provider. Document Revised: 03/30/2022 Document Reviewed: 03/30/2022 Else[employer] Patient Education  2024 [employer] GENERAL RISKS AND COMPLICATIONS  What are the risk, side effects and possible complications? Generally speaking, most procedures are safe.  However, with any procedure there are risks, side effects, and the possibility of complications.  The risks and complications are dependent upon the sites that are lesioned, or the type of nerve block to be performed.  The closer the procedure is to the spine, the more serious the risks are.  Great care is taken when placing the radio frequency needles, block needles or lesioning probes, but sometimes complications can occur. Infection: Any time there is an injection through the skin, there is a risk of infection.  This is why sterile conditions are used for these blocks.  There are four possible types of infection. Localized skin infection. Central Nervous System Infection-This can be in the form of Meningitis, which can be deadly. Epidural Infections-This can be in the form of an epidural abscess, which can cause pressure inside of the spine, causing compression of the spinal cord with subsequent paralysis. This would require an emergency surgery to decompress, and there are no guarantees that the patient would recover from the paralysis. Discitis-This is an infection of the intervertebral discs.  It occurs in about 1% of discography procedures.  It is difficult to treat and it may lead to surgery.        2. Pain: the needles have to go through skin and soft tissues, will cause soreness.        3. Damage to internal structures:  The nerves to be lesioned may be near blood vessels or    other nerves which can be potentially damaged.       4. Bleeding: Bleeding is more common if the patient is taking blood thinners such as  aspirin, Coumadin, Ticiid, Plavix, etc., or if he/she have some genetic predisposition  such as hemophilia. Bleeding into the spinal canal can cause compression of the spinal  cord with subsequent paralysis.  This would require an emergency surgery to  decompress and there are no guarantees that the patient would recover from the  paralysis.       5. Pneumothorax:  Puncturing of a lung is a possibility, every time a needle is introduced in  the area of the chest or upper back.  Pneumothorax refers to free air around the  collapsed lung(s), inside of the thoracic cavity (chest cavity).  Another two possible  complications related to a similar event would include: Hemothorax and Chylothorax.   These are variations of the Pneumothorax, where instead of air around the collapsed  lung(s), you may have blood or chyle, respectively.       6. Spinal headaches: They may occur with any procedures in the area of the spine.         7. Persistent CSF (Cerebro-Spinal Fluid) leakage: This is a rare problem, but may occur  with prolonged intrathecal or epidural catheters either due to the formation of a fistulous  track or a dural tear.       8. Nerve damage: By working so close to the spinal cord, there is always a possibility of  nerve damage, which could be as serious as a permanent spinal cord injury with  paralysis.       9. Death:  Although rare, severe deadly allergic reactions known as "Anaphylactic  reaction" can occur to any of the medications used.      10. Worsening of the symptoms:  We can always make thing worse.  What are the chances of something like this happening? Chances of any of this occuring are extremely low.  By statistics, you have more of a chance of getting killed in a  motor vehicle accident: while driving to the hospital than any of the above occurring .  Nevertheless, you should be aware that they are possibilities.  In general, it is similar to taking a shower.  Everybody knows that you can slip, hit your head and get killed.  Does that mean that you should not shower again?  Nevertheless always keep in mind that statistics do not mean anything if you happen to be on the wrong side of them.  Even if a procedure has a 1 (one) in a 1,000,000 (million) chance of going wrong, it you happen to be that one..Also, keep in mind that by statistics, you have more of a chance of having something go wrong when taking medications.  Who should not have this procedure? If you are on a blood thinning medication (e.g. Coumadin, Plavix, see list of "Blood Thinners"), or if you have an active infection going on, you should not have the procedure.  If you are taking any blood thinners, please inform your physician.  How should I prepare for this procedure? Do not eat or drink anything at least six hours prior to the procedure. Bring a driver with you .  It cannot be a taxi. Come accompanied by an adult that can drive you back, and that is strong enough to help you if your legs get weak or numb from the local anesthetic. Take all of your medicines the morning of the procedure with just enough water to swallow them. If you have diabetes, make sure that you are scheduled to have your procedure done first thing in the morning, whenever possible. If you have diabetes, take only half of your insulin dose and notify our nurse that you have done so as soon as you arrive at the clinic. If you are diabetic, but only take blood sugar pills (oral hypoglycemic), then do not take them on the morning of your procedure.  You may take them after you have had the procedure. Do not take aspirin or any aspirin-containing medications, at least eleven (11) days prior to the procedure.  They may prolong  bleeding. Wear loose fitting clothing that may be easy to take off and that you would not mind if it got stained with Betadine or blood. Do not wear any jewelry or perfume Remove any nail coloring.  It will interfere with some of our monitoring equipment.  NOTE: Remember that this is not meant to be interpreted as a complete list of all possible complications.  Unforeseen problems may occur.  BLOOD THINNERS The following drugs contain aspirin or other products, which can cause   increased bleeding during surgery and should not be taken for 2 weeks prior to and 1 week after surgery.  If you should need take something for relief of minor pain, you may take acetaminophen which is found in Tylenol,m Datril, Anacin-3 and Panadol. It is not blood thinner. The products listed below are.  Do not take any of the products listed below in addition to any listed on your instruction sheet.  A.P.C or A.P.C with Codeine Codeine Phosphate Capsules #3 Ibuprofen Ridaura  ABC compound Congesprin Imuran rimadil  Advil Cope Indocin Robaxisal  Alka-Seltzer Effervescent Pain Reliever and Antacid Coricidin or Coricidin-D  Indomethacin Rufen  Alka-Seltzer plus Cold Medicine Cosprin Ketoprofen S-A-C Tablets  Anacin Analgesic Tablets or Capsules Coumadin Korlgesic Salflex  Anacin Extra Strength Analgesic tablets or capsules CP-2 Tablets Lanoril Salicylate  Anaprox Cuprimine Capsules Levenox Salocol  Anexsia-D Dalteparin Magan Salsalate  Anodynos Darvon compound Magnesium Salicylate Sine-off  Ansaid Dasin Capsules Magsal Sodium Salicylate  Anturane Depen Capsules Marnal Soma  APF Arthritis pain formula Dewitt's Pills Measurin Stanback  Argesic Dia-Gesic Meclofenamic Sulfinpyrazone  Arthritis Bayer Timed Release Aspirin Diclofenac Meclomen Sulindac  Arthritis pain formula Anacin Dicumarol Medipren Supac  Analgesic (Safety coated) Arthralgen Diffunasal Mefanamic Suprofen  Arthritis Strength Bufferin Dihydrocodeine Mepro  Compound Suprol  Arthropan liquid Dopirydamole Methcarbomol with Aspirin Synalgos  ASA tablets/Enseals Disalcid Micrainin Tagament  Ascriptin Doan's Midol Talwin  Ascriptin A/D Dolene Mobidin Tanderil  Ascriptin Extra Strength Dolobid Moblgesic Ticlid  Ascriptin with Codeine Doloprin or Doloprin with Codeine Momentum Tolectin  Asperbuf Duoprin Mono-gesic Trendar  Aspergum Duradyne Motrin or Motrin IB Triminicin  Aspirin plain, buffered or enteric coated Durasal Myochrisine Trigesic  Aspirin Suppositories Easprin Nalfon Trillsate  Aspirin with Codeine Ecotrin Regular or Extra Strength Naprosyn Uracel  Atromid-S Efficin Naproxen Ursinus  Auranofin Capsules Elmiron Neocylate Vanquish  Axotal Emagrin Norgesic Verin  Azathioprine Empirin or Empirin with Codeine Normiflo Vitamin E  Azolid Emprazil Nuprin Voltaren  Bayer Aspirin plain, buffered or children's or timed BC Tablets or powders Encaprin Orgaran Warfarin Sodium  Buff-a-Comp Enoxaparin Orudis Zorpin  Buff-a-Comp with Codeine Equegesic Os-Cal-Gesic   Buffaprin Excedrin plain, buffered or Extra Strength Oxalid   Bufferin Arthritis Strength Feldene Oxphenbutazone   Bufferin plain or Extra Strength Feldene Capsules Oxycodone with Aspirin   Bufferin with Codeine Fenoprofen Fenoprofen Pabalate or Pabalate-SF   Buffets II Flogesic Panagesic   Buffinol plain or Extra Strength Florinal or Florinal with Codeine Panwarfarin   Buf-Tabs Flurbiprofen Penicillamine   Butalbital Compound Four-way cold tablets Penicillin   Butazolidin Fragmin Pepto-Bismol   Carbenicillin Geminisyn Percodan   Carna Arthritis Reliever Geopen Persantine   Carprofen Gold's salt Persistin   Chloramphenicol Goody's Phenylbutazone   Chloromycetin Haltrain Piroxlcam   Clmetidine heparin Plaquenil   Cllnoril Hyco-pap Ponstel   Clofibrate Hydroxy chloroquine Propoxyphen         Before stopping any of these medications, be sure to consult the physician who ordered them.   Some, such as Coumadin (Warfarin) are ordered to prevent or treat serious conditions such as "deep thrombosis", "pumonary embolisms", and other heart problems.  The amount of time that you may need off of the medication may also vary with the medication and the reason for which you were taking it.  If you are taking any of these medications, please make sure you notify your pain physician before you undergo any procedures.         Radiofrequency Ablation Radiofrequency ablation is a procedure that is performed to relieve pain. The   procedure is often used for back, neck, or arm pain. Radiofrequency ablation involves the use of a machine that creates radio waves to make heat. During the procedure, the heat is applied to the nerve that carries the pain signal. The heat damages the nerve and interferes with the pain signal. Pain relief usually starts about 2 weeks after the procedure and lasts for 6 months to 1 year. Tell a health care provider about: Any allergies you have. All medicines you are taking, including vitamins, herbs, eye drops, creams, and over-the-counter medicines. Any problems you or family members have had with anesthetic medicines. Any bleeding problems you have. Any surgeries you have had. Any medical conditions you have. Whether you are pregnant or may be pregnant. What are the risks? Generally, this is a safe procedure. However, problems may occur, including: Pain or soreness at the injection site. Allergic reaction to medicines given during the procedure. Bleeding. Infection at the injection site. Damage to nerves or blood vessels. What happens before the procedure? When to stop eating and drinking Follow instructions from your health care provider about what you may eat and drink before your procedure. These may include: 8 hours before the procedure Stop eating most foods. Do not eat meat, fried foods, or fatty foods. Eat only light foods, such as toast or  crackers. All liquids are okay except energy drinks and alcohol. 6 hours before the procedure Stop eating. Drink only clear liquids, such as water, clear fruit juice, black coffee, plain tea, and sports drinks. Do not drink energy drinks or alcohol. 2 hours before the procedure Stop drinking all liquids. You may be allowed to take medicine with small sips of water. If you do not follow your health care provider's instructions, your procedure may be delayed or canceled. Medicines Ask your health care provider about: Changing or stopping your regular medicines. This is especially important if you are taking diabetes medicines or blood thinners. Taking medicines such as aspirin and ibuprofen. These medicines can thin your blood. Do not take these medicines unless your health care provider tells you to take them. Taking over-the-counter medicines, vitamins, herbs, and supplements. General instructions Ask your health care provider what steps will be taken to help prevent infection. These steps may include: Removing hair at the procedure site. Washing skin with a germ-killing soap. Taking antibiotic medicine. If you will be going home right after the procedure, plan to have a responsible adult: Take you home from the hospital or clinic. You will not be allowed to drive. Care for you for the time you are told. What happens during the procedure?  You will be awake during the procedure. You will need to be able to talk with the health care provider during the procedure. An IV will be inserted into one of your veins. You will be given one or more of the following: A medicine to help you relax (sedative). A medicine to numb the area (local anesthetic). Your health care provider will insert a radiofrequency needle into the area to be treated. This is done with the help of fluoroscopy. A wire that carries the radio waves (electrode) will be put through the radiofrequency needle. An electrical  pulse will be sent through the electrode to verify the correct nerve that is causing your pain. You will feel a tingling sensation, and you may have muscle twitching. The tissue around the needle tip will be heated by an electric current that comes from the radiofrequency machine. This will numb the nerves.   The needle will be removed. A bandage (dressing) will be put on the insertion area. The procedure may vary among health care providers and hospitals. What happens after the procedure? Your blood pressure, heart rate, breathing rate, and blood oxygen level will be monitored until you leave the hospital or clinic. Return to your normal activities as told by your health care provider. Ask your health care provider what activities are safe for you. If you were given a sedative during the procedure, it can affect you for several hours. Do not drive or operate machinery until your health care provider says that it is safe. Summary Radiofrequency ablation is a procedure that is performed to relieve pain. The procedure is often used for back, neck, or arm pain. Radiofrequency ablation involves the use of a machine that creates radio waves to make heat. Plan to have a responsible adult take you home from the hospital or clinic. Do not drive or operate machinery until your health care provider says that it is safe. Return to your normal activities as told by your health care provider. Ask your health care provider what activities are safe for you. This information is not intended to replace advice given to you by your health care provider. Make sure you discuss any questions you have with your health care provider. Document Revised: 03/04/2021 Document Reviewed: 03/04/2021 Elsevier Patient Education  2024 [employer]  
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
## 2024-03-23 ENCOUNTER — Other Ambulatory Visit: Payer: Self-pay | Admitting: Family Medicine

## 2024-03-23 ENCOUNTER — Ambulatory Visit
Admission: RE | Admit: 2024-03-23 | Discharge: 2024-03-23 | Disposition: A | Discharge status: Home / Self Care | Source: Ambulatory Visit | Attending: Family Medicine | Admitting: Family Medicine

## 2024-03-23 DIAGNOSIS — M7989 Other specified soft tissue disorders: Secondary | ICD-10-CM | POA: Diagnosis present

## 2024-03-23 DIAGNOSIS — M79662 Pain in left lower leg: Secondary | ICD-10-CM

## 2024-03-27 ENCOUNTER — Telehealth: Payer: Self-pay

## 2024-03-27 ENCOUNTER — Ambulatory Visit
Admission: RE | Admit: 2024-03-27 | Discharge: 2024-03-27 | Disposition: A | Discharge status: Home / Self Care | Source: Ambulatory Visit | Attending: Student in an Organized Health Care Education/Training Program | Admitting: Student in an Organized Health Care Education/Training Program

## 2024-03-27 ENCOUNTER — Encounter: Payer: Self-pay | Admitting: Student in an Organized Health Care Education/Training Program

## 2024-03-27 ENCOUNTER — Ambulatory Visit: Admitting: Student in an Organized Health Care Education/Training Program

## 2024-03-27 DIAGNOSIS — M47816 Spondylosis without myelopathy or radiculopathy, lumbar region: Secondary | ICD-10-CM

## 2024-03-27 DIAGNOSIS — G894 Chronic pain syndrome: Secondary | ICD-10-CM | POA: Diagnosis not present

## 2024-03-27 MED ORDER — MIDAZOLAM HCL 5 MG/5ML IJ SOLN
0.5000 mg | Freq: Once | INTRAMUSCULAR | Status: AC
  Administered 2024-03-27: 2 mg via INTRAVENOUS

## 2024-03-27 MED ORDER — FENTANYL CITRATE (PF) 100 MCG/2ML IJ SOLN
25.0000 ug | INTRAMUSCULAR | Status: AC | PRN
  Administered 2024-03-27 (×2): 50 ug via INTRAVENOUS

## 2024-03-27 MED ORDER — LACTATED RINGERS IV SOLN: Freq: Once | INTRAVENOUS | Status: AC

## 2024-03-27 MED ORDER — MIDAZOLAM HCL 5 MG/5ML IJ SOLN
INTRAMUSCULAR | Status: AC
  Filled 2024-03-27: qty 5

## 2024-03-27 MED ORDER — DEXAMETHASONE SODIUM PHOSPHATE 10 MG/ML IJ SOLN
INTRAMUSCULAR | Status: AC
  Filled 2024-03-27: qty 2

## 2024-03-27 MED ORDER — DEXAMETHASONE SODIUM PHOSPHATE 10 MG/ML IJ SOLN
20.0000 mg | Freq: Once | INTRAMUSCULAR | Status: AC
  Administered 2024-03-27: 20 mg

## 2024-03-27 MED ORDER — DEXAMETHASONE SODIUM PHOSPHATE 10 MG/ML IJ SOLN: 20.0000 mg | Freq: Once | INTRAMUSCULAR | Status: DC

## 2024-03-27 MED ORDER — LIDOCAINE HCL 2 % IJ SOLN
INTRAMUSCULAR | Status: AC
  Filled 2024-03-27: qty 20

## 2024-03-27 MED ORDER — LIDOCAINE HCL 2 % IJ SOLN
20.0000 mL | Freq: Once | INTRAMUSCULAR | Status: AC
  Administered 2024-03-27: 400 mg

## 2024-03-27 MED ORDER — FENTANYL CITRATE (PF) 100 MCG/2ML IJ SOLN
INTRAMUSCULAR | Status: AC
  Filled 2024-03-27: qty 2

## 2024-03-27 MED ORDER — ROPIVACAINE HCL 2 MG/ML IJ SOLN
INTRAMUSCULAR | Status: AC
  Filled 2024-03-27: qty 20

## 2024-03-27 MED ORDER — ROPIVACAINE HCL 2 MG/ML IJ SOLN
18.0000 mL | Freq: Once | INTRAMUSCULAR | Status: AC
  Administered 2024-03-27: 18 mL via PERINEURAL

## 2024-03-27 NOTE — Patient Instructions (Addendum)
 Post-Procedure Discharge Instructions  Instructions: Apply ice:  Purpose: This will minimize any swelling and discomfort after procedure.  When: Day of procedure, as soon as you get home. How: Fill a plastic sandwich bag with crushed ice. Cover it with a small towel and apply to injection site. How long: (15 min on, 15 min off) Apply for 15 minutes then remove x 15 minutes.  Repeat sequence on day of procedure, until you go to bed. Apply heat:  Purpose: To treat any soreness and discomfort from the procedure. When: Starting the next day after the procedure. How: Apply heat to procedure site starting the day following the procedure. How long: May continue to repeat daily, until discomfort goes away. Food intake: Start with clear liquids (like water) and advance to regular food, as tolerated.  Physical activities: Keep activities to a minimum for the first 8 hours after the procedure. After that, then as tolerated. Driving: If you have received any sedation, be responsible and do not drive. You are not allowed to drive for 24 hours after having sedation. Blood thinner: (Applies only to those taking blood thinners) You may restart your blood thinner 6 hours after your procedure. Insulin: (Applies only to Diabetic patients taking insulin) As soon as you can eat, you may resume your normal dosing schedule. Infection prevention: Keep procedure site clean and dry. Shower daily and clean area with soap and water. Post-procedure Pain Diary: Extremely important that this be done correctly and accurately. Recorded information will be used to determine the next step in treatment. For the purpose of accuracy, follow these rules: Evaluate only the area treated. Do not report or include pain from an untreated area. For the purpose of this evaluation, ignore all other areas of pain, except for the treated area. After your procedure, avoid taking a long nap and attempting to complete the pain diary after you  wake up. Instead, set your alarm clock to go off every hour, on the hour, for the initial 8 hours after the procedure. Document the duration of the numbing medicine, and the relief you are getting from it. Do not go to sleep and attempt to complete it later. It will not be accurate. If you received sedation, it is likely that you were given a medication that may cause amnesia. Because of this, completing the diary at a later time may cause the information to be inaccurate. This information is needed to plan your care. Follow-up appointment: Keep your post-procedure follow-up evaluation appointment after the procedure (usually 2 weeks for most procedures, 6 weeks for radiofrequencies). DO NOT FORGET to bring you pain diary with you.   Expect: (What should I expect to see with my procedure?) From numbing medicine (AKA: Local Anesthetics): Numbness or decrease in pain. You may also experience some weakness, which if present, could last for the duration of the local anesthetic. Onset: Full effect within 15 minutes of injected. Duration: It will depend on the type of local anesthetic used. On the average, 1 to 8 hours.  From steroids (Applies only if steroids were used): Decrease in swelling or inflammation. Once inflammation is improved, relief of the pain will follow. Onset of benefits: Depends on the amount of swelling present. The more swelling, the longer it will take for the benefits to be seen. In some cases, up to 10 days. Duration: Steroids will stay in the system x 2 weeks. Duration of benefits will depend on multiple posibilities including persistent irritating factors. Side-effects: If present, they may typically  last 2 weeks (the duration of the steroids). Frequent: Cramps (if they occur, drink Gatorade and take over-the-counter Magnesium 450-500 mg once to twice a day); water retention with temporary weight gain; increases in blood sugar; decreased immune system response; increased  appetite. Occasional: Facial flushing (red, warm cheeks); mood swings; menstrual changes. Uncommon: Long-term decrease or suppression of natural hormones; bone thinning. (These are more common with higher doses or more frequent use. This is why we prefer that our patients avoid having any injection therapies in other practices.)  Very Rare: Severe mood changes; psychosis; aseptic necrosis. From procedure: Some discomfort is to be expected once the numbing medicine wears off. This should be minimal if ice and heat are applied as instructed.  Call if: (When should I call?) You experience numbness and weakness that gets worse with time, as opposed to wearing off. New onset bowel or bladder incontinence. (Applies only to procedures done in the spine)  Emergency Numbers: Durning business hours (Monday - Thursday, 8:00 AM - 4:00 PM) (Friday, 9:00 AM - 12:00 Noon): [phone] After hours: [phone] NOTE: If you are having a problem and are unable connect with, or to talk to a provider, then go to your nearest urgent care or emergency department. If the problem is serious and urgent, please call 911.  Moderate Conscious Sedation, Adult, Care After After the procedure, it is common to have: Sleepiness for a few hours. Impaired judgment for a few hours. Trouble with balance. Nausea or vomiting if you eat too soon. Follow these instructions at home: For the time period you were told by your health care provider:  Rest. Do not participate in activities where you could fall or become injured. Do not drive or use machinery. Do not drink alcohol. Do not take sleeping pills or medicines that cause drowsiness. Do not make important decisions or sign legal documents. Do not take care of children on your own. Eating and drinking Follow instructions from your health care provider about what you may eat and drink. Drink enough fluid to keep your urine pale yellow. If you vomit: Drink clear  fluids slowly and in small amounts as you are able. Clear fluids include water, ice chips, low-calorie sports drinks, and fruit juice that has water added to it (diluted fruit juice). Eat light and bland foods in small amounts as you are able. These foods include bananas, applesauce, rice, lean meats, toast, and crackers. General instructions Take over-the-counter and prescription medicines only as told by your health care provider. Have a responsible adult stay with you for the time you are told. Do not use any products that contain nicotine or tobacco. These products include cigarettes, chewing tobacco, and vaping devices, such as e-cigarettes. If you need help quitting, ask your health care provider. Return to your normal activities as told by your health care provider. Ask your health care provider what activities are safe for you. Your health care provider may give you more instructions. Make sure you know what you can and cannot do.   Contact a health care provider if: You are still sleepy or having trouble with balance after 24 hours. You feel light-headed. You vomit every time you eat or drink. You get a rash. You have a fever. You have redness or swelling around the IV site. Get help right away if: You have trouble breathing. You start to feel confused at home. These symptoms may be an emergency. Get help right away. Call 911. Do not wait to see  if the symptoms will go away. Do not drive yourself to the hospital. This information is not intended to replace advice given to you by your health care provider. Make sure you discuss any questions you have with your health care provider. Document Revised: 03/30/2022 Document Reviewed: 03/30/2022 Elsevier Patient Education  2024 [employer].

## 2024-03-27 NOTE — Progress Notes (Signed)
 PROVIDER NOTE: Interpretation of information contained herein should be left to medically-trained personnel. Specific patient instructions are provided elsewhere under Patient Instructions section of medical record. This document was created in part using STT-dictation technology, any transcriptional errors that may result from this process are unintentional.  Patient: Marilyn Hendricks Type: Established DOB: 09-03-1976 MRN: [id number] PCP: Harvey Gaetana CROME, NP  Service: Procedure DOS: 03/27/2024 Setting: Ambulatory Location: Ambulatory outpatient facility Delivery: Face-to-face Provider: Wallie Sherry, MD Specialty: Interventional Pain Management Specialty designation: 09 Location: Outpatient facility Ref. Prov.: Sherry Wallie, MD       Interventional Therapy   Procedure: Lumbar Facet, Medial Branch Radiofrequency Ablation (RFA) #2  Laterality: Bilateral (-50)  Level: L3, L4, and L5 Medial Branch Level(s). These levels will denervate the L3-4 and L4-5 lumbar facet joints.  Imaging: Fluoroscopy-guided         Anesthesia: Local anesthesia (1-2% Lidocaine ) Sedation: Moderate Sedation                       DOS: 03/27/2024  Performed by: Wallie Sherry, MD  Purpose: Therapeutic/Palliative Indications: Low back pain severe enough to impact quality of life or function. Indications: Lumbar facet arthropathy  Marilyn Hendricks has been dealing with the above chronic pain for longer than three months and has either failed to respond, was unable to tolerate, or simply did not get enough benefit from other more conservative therapies including, but not limited to: 1. Over-the-counter medications 2. Anti-inflammatory medications 3. Muscle relaxants 4. Membrane stabilizers 5. Opioids 6. Physical therapy and/or chiropractic manipulation 7. Modalities (Heat, ice, etc.) 8. Invasive techniques such as nerve blocks. Marilyn Hendricks has attained more than 50% relief of the pain from a series of diagnostic  injections conducted in separate occasions.  Pain Score: Pre-procedure: 9 /10 Post-procedure: 0-No pain/10     Position / Prep / Materials:  Position: Prone  Prep solution: ChloraPrep (2% chlorhexidine gluconate and 70% isopropyl alcohol) Prep Area: Entire Lumbosacral Region (Lower back from mid-thoracic region to end of tailbone and from flank to flank.) Materials:  Tray: RFA (Radiofrequency) tray Needle(s):  Type: RFA (Teflon-coated radiofrequency ablation needles) Gauge (G): 22  Length: Regular (10cm) Qty: 3      H&P (Pre-op Assessment):  Marilyn Hendricks is a 48 y.o. (year old), female patient, seen today for interventional treatment. She  has a past surgical history that includes Tubal ligation and Cholecystectomy. Marilyn Hendricks has a current medication list which includes the following prescription(s): acetaminophen , albuterol , cyanocobalamin, fluticasone, ibuprofen , semaglutide-weight management, aerochamber mv, and gabapentin. Her primarily concern today is the Back Pain (Lower)  Initial Vital Signs:  Pulse/HCG Rate: 84ECG Heart Rate: 70 Temp: (!) 97 F (36.1 C) Resp: 20 BP: 119/82 SpO2: 100 %  BMI: Estimated body mass index is 28.44 kg/m as calculated from the following:   Height as of this encounter: 5' 1.5 (1.562 m).   Weight as of this encounter: 153 lb (69.4 kg).  Risk Assessment: Allergies: Reviewed. She is allergic to doxycycline , beeswax, carrot [daucus carota], cats claw [uncaria tomentosa (cats claw)], erythromycin, and prednisone .  Allergy Precautions: None required Coagulopathies: Reviewed. None identified.  Blood-thinner therapy: None at this time Active Infection(s): Reviewed. None identified. Marilyn Hendricks is afebrile  Site Confirmation: Marilyn Hendricks was asked to confirm the procedure and laterality before marking the site Procedure checklist: Completed Consent: Before the procedure and under the influence of no sedative(s), amnesic(s), or anxiolytics, the  patient was informed of the treatment options, risks and possible  complications. To fulfill our ethical and legal obligations, as recommended by the American Medical Association's Code of Ethics, I have informed the patient of my clinical impression; the nature and purpose of the treatment or procedure; the risks, benefits, and possible complications of the intervention; the alternatives, including doing nothing; the risk(s) and benefit(s) of the alternative treatment(s) or procedure(s); and the risk(s) and benefit(s) of doing nothing. The patient was provided information about the general risks and possible complications associated with the procedure. These may include, but are not limited to: failure to achieve desired goals, infection, bleeding, organ or nerve damage, allergic reactions, paralysis, and death. In addition, the patient was informed of those risks and complications associated to Spine-related procedures, such as failure to decrease pain; infection (i.e.: Meningitis, epidural or intraspinal abscess); bleeding (i.e.: epidural hematoma, subarachnoid hemorrhage, or any other type of intraspinal or peri-dural bleeding); organ or nerve damage (i.e.: Any type of peripheral nerve, nerve root, or spinal cord injury) with subsequent damage to sensory, motor, and/or autonomic systems, resulting in permanent pain, numbness, and/or weakness of one or several areas of the body; allergic reactions; (i.e.: anaphylactic reaction); and/or death. Furthermore, the patient was informed of those risks and complications associated with the medications. These include, but are not limited to: allergic reactions (i.e.: anaphylactic or anaphylactoid reaction(s)); adrenal axis suppression; blood sugar elevation that in diabetics may result in ketoacidosis or comma; water retention that in patients with history of congestive heart failure may result in shortness of breath, pulmonary edema, and decompensation with resultant  heart failure; weight gain; swelling or edema; medication-induced neural toxicity; particulate matter embolism and blood vessel occlusion with resultant organ, and/or nervous system infarction; and/or aseptic necrosis of one or more joints. Finally, the patient was informed that Medicine is not an exact science; therefore, there is also the possibility of unforeseen or unpredictable risks and/or possible complications that may result in a catastrophic outcome. The patient indicated having understood very clearly. We have given the patient no guarantees and we have made no promises. Enough time was given to the patient to ask questions, all of which were answered to the patient's satisfaction. Marilyn Hendricks has indicated that she wanted to continue with the procedure. Attestation: I, the ordering provider, attest that I have discussed with the patient the benefits, risks, side-effects, alternatives, likelihood of achieving goals, and potential problems during recovery for the procedure that I have provided informed consent. Date  Time: 03/27/2024  8:55 AM   Pre-Procedure Preparation:  Monitoring: As per clinic protocol. Respiration, ETCO2, SpO2, BP, heart rate and rhythm monitor placed and checked for adequate function Safety Precautions: Patient was assessed for positional comfort and pressure points before starting the procedure. Time-out: I initiated and conducted the Time-out before starting the procedure, as per protocol. The patient was asked to participate by confirming the accuracy of the Time Out information. Verification of the correct person, site, and procedure were performed and confirmed by me, the nursing staff, and the patient. Time-out conducted as per Joint Commission's Universal Protocol ([email or web address].01.01.01). Time: 1045 Start Time: 1045 hrs.  Description of Procedure:          Laterality: See above. Levels:  See above. Safety Precautions: Aspiration looking for blood return was  conducted prior to all injections. At no point did we inject any substances, as a needle was being advanced. Before injecting, the patient was told to immediately notify me if she was experiencing any new onset of ringing in the ears, or metallic  taste in the mouth. No attempts were made at seeking any paresthesias. Safe injection practices and needle disposal techniques used. Medications properly checked for expiration dates. SDV (single dose vial) medications used. After the completion of the procedure, all disposable equipment used was discarded in the proper designated medical waste containers. Local Anesthesia: Protocol guidelines were followed. The patient was positioned over the fluoroscopy table. The area was prepped in the usual manner. The time-out was completed. The target area was identified using fluoroscopy. A 12-in long, straight, sterile hemostat was used with fluoroscopic guidance to locate the targets for each level blocked. Once located, the skin was marked with an approved surgical skin marker. Once all sites were marked, the skin (epidermis, dermis, and hypodermis), as well as deeper tissues (fat, connective tissue and muscle) were infiltrated with a small amount of a short-acting local anesthetic, loaded on a 10cc syringe with a 25G, 1.5-in  Needle. An appropriate amount of time was allowed for local anesthetics to take effect before proceeding to the next step. Technical description of process:  Radiofrequency Ablation (RFA) L3 Medial Branch Nerve RFA: The target area for the L3 medial branch is at the junction of the postero-lateral aspect of the superior articular process and the superior, posterior, and medial edge of the transverse process of L4. Under fluoroscopic guidance, a Radiofrequency needle was inserted until contact was made with os over the superior postero-lateral aspect of the pedicular shadow (target area). Sensory and motor testing was conducted to properly adjust the  position of the needle. Once satisfactory placement of the needle was achieved, the numbing solution was slowly injected after negative aspiration for blood. 2.0 mL of the nerve block solution was injected without difficulty or complication. After waiting for at least 3 minutes, the ablation was performed. Once completed, the needle was removed intact. L4 Medial Branch Nerve RFA: The target area for the L4 medial branch is at the junction of the postero-lateral aspect of the superior articular process and the superior, posterior, and medial edge of the transverse process of L5. Under fluoroscopic guidance, a Radiofrequency needle was inserted until contact was made with os over the superior postero-lateral aspect of the pedicular shadow (target area). Sensory and motor testing was conducted to properly adjust the position of the needle. Once satisfactory placement of the needle was achieved, the numbing solution was slowly injected after negative aspiration for blood. 2.0 mL of the nerve block solution was injected without difficulty or complication. After waiting for at least 3 minutes, the ablation was performed. Once completed, the needle was removed intact. L5 Medial Branch Nerve RFA: The target area for the L5 medial branch is at the junction of the postero-lateral aspect of the superior articular process of S1 and the superior, posterior, and medial edge of the sacral ala. Under fluoroscopic guidance, a Radiofrequency needle was inserted until contact was made with os over the superior postero-lateral aspect of the pedicular shadow (target area). Sensory and motor testing was conducted to properly adjust the position of the needle. Once satisfactory placement of the needle was achieved, the numbing solution was slowly injected after negative aspiration for blood. 2.0 mL of the nerve block solution was injected without difficulty or complication. After waiting for at least 3 minutes, the ablation was  performed. Once completed, the needle was removed intact. Radiofrequency lesioning (ablation):  Radiofrequency Generator: Medtronic AccurianTM AG 1000 RF Generator Sensory Stimulation Parameters: 50 Hz was used to locate & identify the nerve, making sure that the  needle was positioned such that there was no sensory stimulation below 0.3 V or above 0.7 V. Motor Stimulation Parameters: 2 Hz was used to evaluate the motor component. Care was taken not to lesion any nerves that demonstrated motor stimulation of the lower extremities at an output of less than 2.5 times that of the sensory threshold, or a maximum of 2.0 V. Lesioning Technique Parameters: Standard Radiofrequency settings. (Not bipolar or pulsed.) Temperature Settings: 80 degrees C Lesioning time: 60 seconds Stationary intra-operative compliance: Compliant  Once the entire procedure was completed, the treated area was cleaned, making sure to leave some of the prepping solution back to take advantage of its long term bactericidal properties.    Illustration of the posterior view of the lumbar spine and the posterior neural structures. Laminae of L2 through S1 are labeled. DPRL5, dorsal primary ramus of L5; DPRS1, dorsal primary ramus of S1; DPR3, dorsal primary ramus of L3; FJ, facet (zygapophyseal) joint L3-L4; I, inferior articular process of L4; LB1, lateral branch of dorsal primary ramus of L1; IAB, inferior articular branches from L3 medial branch (supplies L4-L5 facet joint); IBP, intermediate branch plexus; MB3, medial branch of dorsal primary ramus of L3; NR3, third lumbar nerve root; S, superior articular process of L5; SAB, superior articular branches from L4 (supplies L4-5 facet joint also); TP3, transverse process of L3.  Facet Joint Innervation (* possible contribution)  L1-2 T12, L1 (L2*)  Medial Branch  L2-3 L1, L2 (L3*)                     L3-4 L2, L3 (L4*)                     L4-5 L3, L4 (L5*)                      L5-S1 L4, L5, S1                        Vitals:   03/27/24 1114 03/27/24 1124 03/27/24 1134 03/27/24 1144  BP: 110/82 100/74 101/72 93/71  Pulse: 78     Resp:  20 16 16   Temp:    (!) 97.3 F (36.3 C)  SpO2: 100% 95% 95% 96%  Weight:      Height:        Start Time: 1045 hrs. End Time: 1111 hrs.  Imaging Guidance (Spinal):          Type of Imaging Technique: Fluoroscopy Guidance (Spinal) Indication(s): Fluoroscopy guidance for needle placement to enhance accuracy in procedures requiring precise needle localization for targeted delivery of medication in or near specific anatomical locations not easily accessible without such real-time imaging assistance. Exposure Time: Please see nurses notes. Contrast: None used. Fluoroscopic Guidance: I was personally present during the use of fluoroscopy. Tunnel Vision Technique used to obtain the best possible view of the target area. Parallax error corrected before commencing the procedure. Direction-depth-direction technique used to introduce the needle under continuous pulsed fluoroscopy. Once target was reached, antero-posterior, oblique, and lateral fluoroscopic projection used confirm needle placement in all planes. Images permanently stored in EMR. Interpretation: No contrast injected. I personally interpreted the imaging intraoperatively. Adequate needle placement confirmed in multiple planes. Permanent images saved into the patient's record.  Antibiotic Prophylaxis:   Anti-infectives (From admission, onward)    None      Indication(s): None identified  Post-operative Assessment:  Post-procedure Vital Signs:  Pulse/HCG Rate: 7870  Temp: (!) 97.3 F (36.3 C) Resp: 16 BP: 93/71 SpO2: 96 %  EBL: None  Complications: No immediate post-treatment complications observed by team, or reported by patient.  Note: The patient tolerated the entire procedure well. A repeat set of vitals were taken after the procedure and the patient  was kept under observation following institutional policy, for this type of procedure. Post-procedural neurological assessment was performed, showing return to baseline, prior to discharge. The patient was provided with post-procedure discharge instructions, including a section on how to identify potential problems. Should any problems arise concerning this procedure, the patient was given instructions to immediately contact us , at any time, without hesitation. In any case, we plan to contact the patient by telephone for a follow-up status report regarding this interventional procedure.  Comments:  No additional relevant information.  Plan of Care (POC)   Orders Placed This Encounter  Procedures   DG PAIN CLINIC C-ARM 1-60 MIN NO REPORT    Intraoperative interpretation by procedural physician at [facility].    Standing Status:   Standing    Number of Occurrences:   1    Reason for exam::   Assistance in needle guidance and placement for procedures requiring needle placement in or near specific anatomical locations not easily accessible without such assistance.    Medications ordered for procedure: Meds ordered this encounter  Medications   lidocaine  (XYLOCAINE ) 2 % (with pres) injection 400 mg   lactated ringers  infusion   midazolam  (VERSED ) 5 MG/5ML injection 0.5-2 mg    Make sure Flumazenil is available in the pyxis when using this medication. If oversedation occurs, administer 0.2 mg IV over 15 sec. If after 45 sec no response, administer 0.2 mg again over 1 min; may repeat at 1 min intervals; not to exceed 4 doses (1 mg)   fentaNYL (SUBLIMAZE) injection 25-50 mcg    Make sure Narcan is available in the pyxis when using this medication. In the event of respiratory depression (RR< 8/min): Titrate NARCAN (naloxone) in increments of 0.1 to 0.2 mg IV at 2-3 minute intervals, until desired degree of reversal.   DISCONTD: dexamethasone  (DECADRON ) injection 20 mg   ropivacaine  (PF)  2 mg/mL (0.2%) (NAROPIN ) injection 18 mL   dexamethasone  (DECADRON ) injection 20 mg   Medications administered: We administered lidocaine , lactated ringers , midazolam , fentaNYL, ropivacaine  (PF) 2 mg/mL (0.2%), and dexamethasone .  See the medical record for exact dosing, route, and time of administration.  Follow-up plan:   Return in about 4 weeks (around 04/24/2024) for PPE, F2F DISCUSS NECK PROCEDURE.       BL L3-L5 RFA 09/27/23, 03/27/24    Recent Visits Date Type Provider Dept  02/15/24 Office Visit Marcelino Nurse, MD [facility]  01/17/24 Procedure visit Marcelino Nurse, MD [facility]  Showing recent visits within past 90 days and meeting all other requirements Today's Visits Date Type Provider Dept  03/27/24 Procedure visit Marcelino Nurse, MD [facility]  Showing today's visits and meeting all other requirements Future Appointments Date Type Provider Dept  04/25/24 Appointment Marcelino Nurse, MD [facility]  Showing future appointments within next 90 days and meeting all other requirements  Disposition: Discharge home  Discharge (Date  Time): 03/27/2024; 1150 hrs.   Primary Care Physician: Harvey Gaetana CROME, NP Location: [facility] Note by: Nurse Marcelino, MD (TTS technology used. I apologize for any typographical errors that were not detected and corrected.) Date: 03/27/2024; Time: 12:16 PM  Disclaimer:  Medicine is not an Visual merchandiser. The only guarantee in medicine is that nothing is guaranteed. It is important to note that the decision to proceed with this intervention was based on the information collected from the patient. The Data and conclusions were drawn from the patient's questionnaire, the interview, and the physical examination. Because the information was provided in large part by the patient, it cannot be guaranteed that it has not been purposely or unconsciously manipulated. Every effort has been  made to obtain as much relevant data as possible for this evaluation. It is important to note that the conclusions that lead to this procedure are derived in large part from the available data. Always take into account that the treatment will also be dependent on availability of resources and existing treatment guidelines, considered by other Pain Management Practitioners as being common knowledge and practice, at the time of the intervention. For Medico-Legal purposes, it is also important to point out that variation in procedural techniques and pharmacological choices are the acceptable norm. The indications, contraindications, technique, and results of the above procedure should only be interpreted and judged by a Board-Certified Interventional Pain Specialist with extensive familiarity and expertise in the same exact procedure and technique.

## 2024-03-27 NOTE — Telephone Encounter (Signed)
 Spoke with patient and she states that she has some numbness and tingling in her bilateral lower extremities.  States she feels like they are asleep.  Informed her that this is sometimes a side effect from the procedure and to be very careful when walking.  Instructed her to have someone help her when ambulating to and from the bathroom.  Denies any fever, or other symptoms.  Dr Marcelino notified..  Instructed patient to call us  back for any increase in symptoms or any further concerns.  Patient states understanding.

## 2024-03-28 ENCOUNTER — Telehealth: Payer: Self-pay

## 2024-03-28 NOTE — Telephone Encounter (Signed)
 Post procedure follow up.  LM

## 2024-04-13 ENCOUNTER — Telehealth: Payer: Self-pay | Admitting: Student in an Organized Health Care Education/Training Program

## 2024-04-13 NOTE — Telephone Encounter (Signed)
 Patient needs new work limit note for her work. Last one was Nov 20, 24. The work is asking for one to last a year. Please call patient with any questions,

## 2024-04-25 ENCOUNTER — Telehealth: Payer: Self-pay

## 2024-04-25 ENCOUNTER — Ambulatory Visit: Attending: Student in an Organized Health Care Education/Training Program | Admitting: Nurse Practitioner

## 2024-04-25 VITALS — BP 121/84 | HR 84 | Temp 98.0°F | Resp 16 | Ht 63.5 in | Wt 152.0 lb

## 2024-04-25 DIAGNOSIS — M5416 Radiculopathy, lumbar region: Secondary | ICD-10-CM | POA: Insufficient documentation

## 2024-04-25 DIAGNOSIS — G894 Chronic pain syndrome: Secondary | ICD-10-CM | POA: Diagnosis not present

## 2024-04-25 DIAGNOSIS — M5412 Radiculopathy, cervical region: Secondary | ICD-10-CM | POA: Insufficient documentation

## 2024-04-25 DIAGNOSIS — M47812 Spondylosis without myelopathy or radiculopathy, cervical region: Secondary | ICD-10-CM | POA: Diagnosis not present

## 2024-04-25 DIAGNOSIS — M47816 Spondylosis without myelopathy or radiculopathy, lumbar region: Secondary | ICD-10-CM | POA: Diagnosis present

## 2024-04-25 DIAGNOSIS — M7918 Myalgia, other site: Secondary | ICD-10-CM | POA: Insufficient documentation

## 2024-04-25 DIAGNOSIS — M5481 Occipital neuralgia: Secondary | ICD-10-CM | POA: Insufficient documentation

## 2024-04-25 MED ORDER — UBRELVY 100 MG PO TABS: 1.0000 | ORAL_TABLET | Freq: Every day | ORAL | 0 refills | Status: AC | PRN
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
## 2024-04-25 NOTE — Progress Notes (Signed)
 Safety precautions to be maintained throughout the outpatient stay will include: orient to surroundings, keep bed in low position, maintain call bell within reach at all times, provide assistance with transfer out of bed and ambulation.

## 2024-04-27 ENCOUNTER — Other Ambulatory Visit: Payer: Self-pay | Admitting: Nurse Practitioner

## 2024-04-27 ENCOUNTER — Telehealth: Payer: Self-pay

## 2024-04-27 MED ORDER — SUMATRIPTAN SUCCINATE 50 MG PO TABS: 50.0000 mg | ORAL_TABLET | ORAL | 0 refills | Status: DC | PRN

## 2024-04-27 NOTE — Telephone Encounter (Signed)
 Ubrelvy  denied,  may be able to approve this drug when you have tried other drugs first (a trial and failure of or contraindication to 2 or more preferred triptans [sumatriptan  and rizatriptan]

## 2024-04-27 NOTE — Telephone Encounter (Signed)
 Error

## 2024-05-08 ENCOUNTER — Ambulatory Visit
Admission: RE | Admit: 2024-05-08 | Discharge: 2024-05-08 | Disposition: A | Discharge status: Home / Self Care | Source: Ambulatory Visit | Attending: Student in an Organized Health Care Education/Training Program | Admitting: Student in an Organized Health Care Education/Training Program

## 2024-05-08 ENCOUNTER — Encounter: Payer: Self-pay | Admitting: Student in an Organized Health Care Education/Training Program

## 2024-05-08 ENCOUNTER — Ambulatory Visit: Admitting: Student in an Organized Health Care Education/Training Program

## 2024-05-08 VITALS — BP 102/81 | HR 79 | Temp 97.3°F | Resp 16 | Ht 61.5 in | Wt 154.0 lb

## 2024-05-08 DIAGNOSIS — G894 Chronic pain syndrome: Secondary | ICD-10-CM

## 2024-05-08 DIAGNOSIS — M5481 Occipital neuralgia: Secondary | ICD-10-CM | POA: Diagnosis not present

## 2024-05-08 DIAGNOSIS — M47812 Spondylosis without myelopathy or radiculopathy, cervical region: Secondary | ICD-10-CM | POA: Diagnosis not present

## 2024-05-08 MED ORDER — DEXAMETHASONE SODIUM PHOSPHATE 10 MG/ML IJ SOLN
20.0000 mg | Freq: Once | INTRAMUSCULAR | Status: AC
  Administered 2024-05-08 (×2): 20 mg

## 2024-05-08 MED ORDER — ROPIVACAINE HCL 2 MG/ML IJ SOLN
18.0000 mL | Freq: Once | INTRAMUSCULAR | Status: AC
  Administered 2024-05-08 (×2): 18 mL via PERINEURAL

## 2024-05-08 MED ORDER — MIDAZOLAM HCL 2 MG/2ML IJ SOLN
0.5000 mg | Freq: Once | INTRAMUSCULAR | Status: AC
  Administered 2024-05-08 (×2): 2 mg via INTRAVENOUS
  Filled 2024-05-08: qty 2

## 2024-05-08 MED ORDER — LACTATED RINGERS IV SOLN: Freq: Once | INTRAVENOUS | Status: AC

## 2024-05-08 MED ORDER — ROPIVACAINE HCL 2 MG/ML IJ SOLN
INTRAMUSCULAR | Status: AC
  Filled 2024-05-08: qty 20

## 2024-05-08 MED ORDER — LIDOCAINE HCL (PF) 2 % IJ SOLN
INTRAMUSCULAR | Status: AC
  Filled 2024-05-08: qty 10

## 2024-05-08 MED ORDER — LIDOCAINE HCL 2 % IJ SOLN
20.0000 mL | Freq: Once | INTRAMUSCULAR | Status: AC
  Administered 2024-05-08 (×2): 100 mg

## 2024-05-08 MED ORDER — DEXAMETHASONE SODIUM PHOSPHATE 10 MG/ML IJ SOLN
INTRAMUSCULAR | Status: AC
  Filled 2024-05-08: qty 2

## 2024-05-08 NOTE — Progress Notes (Signed)
 PROVIDER NOTE: Interpretation of information contained herein should be left to medically-trained personnel. Specific patient instructions are provided elsewhere under Patient Instructions section of medical record. This document was created in part using STT-dictation technology, any transcriptional errors that may result from this process are unintentional.  Patient: Marilyn Hendricks Type: Established DOB: [birth year] MRN: [id number] PCP: Harvey Gaetana CROME, NP  Service: Procedure DOS: 05/08/2024 Setting: Ambulatory Location: Ambulatory outpatient facility Delivery: Face-to-face Provider: Wallie Sherry, MD Specialty: Interventional Pain Management Specialty designation: 09 Location: Outpatient facility Ref. Prov.: Sherry Wallie, MD       Interventional Therapy   Primary Reason for Visit: Interventional Pain Management Treatment. CC: Neck Pain    Procedure:          Anesthesia, Analgesia, Anxiolysis:  Type: Diagnostic, Greater, Occipital Nerve Block  #1  Region: Posterolateral Cervical Level: Occipital Ridge   Laterality: Right  Anesthesia: Local (1-2% Lidocaine )  Anxiolysis: None  Sedation: None  Guidance: Anatomical landmarks           Position: Prone   1. Cervical facet joint syndrome   2. Occipital neuralgia of right side   3. Chronic pain syndrome    NAS-11 Pain score:   Pre-procedure: 5 /10   Post-procedure: 4 /10     H&P (Pre-op Assessment):  Marilyn Hendricks is a 48 y.o. (year old), female patient, seen today for interventional treatment. She  has a past surgical history that includes Tubal ligation and Cholecystectomy. Marilyn Hendricks has a current medication list which includes the following prescription(s): sumatriptan , turpentine, acetaminophen , albuterol , fluticasone, gabapentin, ibuprofen , semaglutide-weight management, aerochamber mv, and ubrelvy , and the following Facility-Administered Medications: lactated ringers . Her primarily concern today is the Neck Pain  Initial  Vital Signs:  Pulse/HCG Rate: 79ECG Heart Rate: 77 Temp: (!) 97.3 F (36.3 C) Resp: 16 BP: 114/84 SpO2: 99 %  BMI: Estimated body mass index is 28.63 kg/m as calculated from the following:   Height as of this encounter: 5' 1.5 (1.562 m).   Weight as of this encounter: 154 lb (69.9 kg).  Risk Assessment: Allergies: Reviewed. She is allergic to doxycycline , beeswax, carrot [daucus carota], cats claw [uncaria tomentosa (cats claw)], erythromycin, and prednisone .  Allergy Precautions: None required Coagulopathies: Reviewed. None identified.  Blood-thinner therapy: None at this time Active Infection(s): Reviewed. None identified. Marilyn Hendricks is afebrile  Site Confirmation: Marilyn Hendricks was asked to confirm the procedure and laterality before marking the site Procedure checklist: Completed Consent: Before the procedure and under the influence of no sedative(s), amnesic(s), or anxiolytics, the patient was informed of the treatment options, risks and possible complications. To fulfill our ethical and legal obligations, as recommended by the American Medical Association's Code of Ethics, I have informed the patient of my clinical impression; the nature and purpose of the treatment or procedure; the risks, benefits, and possible complications of the intervention; the alternatives, including doing nothing; the risk(s) and benefit(s) of the alternative treatment(s) or procedure(s); and the risk(s) and benefit(s) of doing nothing. The patient was provided information about the general risks and possible complications associated with the procedure. These may include, but are not limited to: failure to achieve desired goals, infection, bleeding, organ or nerve damage, allergic reactions, paralysis, and death. In addition, the patient was informed of those risks and complications associated to the procedure, such as failure to decrease pain; infection; bleeding; organ or nerve damage with subsequent damage to  sensory, motor, and/or autonomic systems, resulting in permanent pain, numbness, and/or weakness of one or  several areas of the body; allergic reactions; (i.e.: anaphylactic reaction); and/or death. Furthermore, the patient was informed of those risks and complications associated with the medications. These include, but are not limited to: allergic reactions (i.e.: anaphylactic or anaphylactoid reaction(s)); adrenal axis suppression; blood sugar elevation that in diabetics may result in ketoacidosis or comma; water retention that in patients with history of congestive heart failure may result in shortness of breath, pulmonary edema, and decompensation with resultant heart failure; weight gain; swelling or edema; medication-induced neural toxicity; particulate matter embolism and blood vessel occlusion with resultant organ, and/or nervous system infarction; and/or aseptic necrosis of one or more joints. Finally, the patient was informed that Medicine is not an exact science; therefore, there is also the possibility of unforeseen or unpredictable risks and/or possible complications that may result in a catastrophic outcome. The patient indicated having understood very clearly. We have given the patient no guarantees and we have made no promises. Enough time was given to the patient to ask questions, all of which were answered to the patient's satisfaction. Marilyn Hendricks has indicated that she wanted to continue with the procedure. Attestation: I, the ordering provider, attest that I have discussed with the patient the benefits, risks, side-effects, alternatives, likelihood of achieving goals, and potential problems during recovery for the procedure that I have provided informed consent. Date  Time: 05/08/2024  7:49 AM  Pre-Procedure Preparation:  Monitoring: As per clinic protocol. Respiration, ETCO2, SpO2, BP, heart rate and rhythm monitor placed and checked for adequate function Safety Precautions: Patient was  assessed for positional comfort and pressure points before starting the procedure. Time-out: I initiated and conducted the Time-out before starting the procedure, as per protocol. The patient was asked to participate by confirming the accuracy of the Time Out information. Verification of the correct person, site, and procedure were performed and confirmed by me, the nursing staff, and the patient. Time-out conducted as per Joint Commission's Universal Protocol (UP.01.01.01). Time: 0827 Start Time: 0827 hrs.  Description of Procedure:          Target Area: Area medial to the occipital artery at the level of the superior nuchal ridge Approach: Posterior approach Area Prepped: Entire Posterior Occipital Region ChloraPrep (2% chlorhexidine gluconate and 70% isopropyl alcohol) Safety Precautions: Aspiration looking for blood return was conducted prior to all injections. At no point did we inject any substances, as a needle was being advanced. No attempts were made at seeking any paresthesias. Safe injection practices and needle disposal techniques used. Medications properly checked for expiration dates. SDV (single dose vial) medications used. Description of the Procedure: Protocol guidelines were followed. The target area was identified and the area prepped in the usual manner. Skin & deeper tissues infiltrated with local anesthetic. Appropriate amount of time allowed to pass for local anesthetics to take effect. The procedure needles were then advanced to the target area. Proper needle placement secured. Negative aspiration confirmed. Solution injected in intermittent fashion, asking for systemic symptoms every 0.5cc of injectate. The needles were then removed and the area cleansed, making sure to leave some of the prepping solution back to take advantage of its long term bactericidal properties.  Vitals:   05/08/24 0829 05/08/24 0834 05/08/24 0836 05/08/24 0841  BP: (!) 107/93 (!) 109/91 97/86  102/81  Pulse:    79  Resp: (!) 30 18 19 16   Temp:      SpO2: 97% 98% 97% 100%  Weight:      Height:  Start Time: 0827 hrs. End Time: 0835 hrs. Materials:  Needle(s) Type: Regular needle Gauge: 22G Length: 1.5-in Medication(s): Please see orders for medications and dosing details. 5 cc solution made of 4 cc of 0.2% Ropivacaine , 1 cc of Decadron  10 mg/cc. Injected for the right GONB.   Antibiotic Prophylaxis:   Anti-infectives (From admission, onward)    None      Indication(s): None identified  Post-operative Assessment:  Post-procedure Vital Signs:  Pulse/HCG Rate: 7981 Temp: (!) 97.3 F (36.3 C) Resp: 16 BP: 102/81 SpO2: 100 %  EBL: None  Complications: No immediate post-treatment complications observed by team, or reported by patient.  Note: The patient tolerated the entire procedure well. A repeat set of vitals were taken after the procedure and the patient was kept under observation following institutional policy, for this type of procedure. Post-procedural neurological assessment was performed, showing return to baseline, prior to discharge. The patient was provided with post-procedure discharge instructions, including a section on how to identify potential problems. Should any problems arise concerning this procedure, the patient was given instructions to immediately contact us , at any time, without hesitation. In any case, we plan to contact the patient by telephone for a follow-up status report regarding this interventional procedure.  Comments:  No additional relevant information.  Plan of Care (POC)  Orders:  Orders Placed This Encounter  Procedures   DG PAIN CLIN[facility] C-ARM 1-60 MIN NO REPORT    Intraoperative interpretation by procedural physician at [facility].    Standing Status:   Standing    Number of Occurrences:   1    Reason for exam::   Assistance in needle guidance and placement for procedures requiring needle placement in or  near specific anatomical locations not easily accessible without such assistance.    Medications ordered for procedure: Meds ordered this encounter  Medications   lidocaine  (XYLOCAINE ) 2 % (with pres) injection 400 mg   lactated ringers  infusion   midazolam  (VERSED ) injection 0.5-2 mg    Make sure Flumazenil is available in the pyxis when using this medication. If oversedation occurs, administer 0.2 mg IV over 15 sec. If after 45 sec no response, administer 0.2 mg again over 1 min; may repeat at 1 min intervals; not to exceed 4 doses (1 mg)   ropivacaine  (PF) 2 mg/mL (0.2%) (NAROPIN ) injection 18 mL   dexamethasone  (DECADRON ) injection 20 mg   Medications administered: We administered lidocaine , lactated ringers , midazolam , ropivacaine  (PF) 2 mg/mL (0.2%), and dexamethasone .  See the medical record for exact dosing, route, and time of administration.    Follow-up plan:   Return in about 4 weeks (around 06/05/2024) for PPE F2F .     Recent Visits Date Type Provider Dept  04/25/24 Office Visit Patel, Seema K, NP [facility]  03/27/24 Procedure visit Marcelino Nurse, MD [facility]  02/15/24 Office Visit Marcelino Nurse, MD [facility]  Showing recent visits within past 90 days and meeting all other requirements Today's Visits Date Type Provider Dept  05/08/24 Procedure visit Marcelino Nurse, MD [facility]  Showing today's visits and meeting all other requirements Future Appointments Date Type Provider Dept  06/06/24 Appointment Marcelino Nurse, MD [facility]  Showing future appointments within next 90 days and meeting all other requirements   Disposition: Discharge home  Discharge (Date  Time): 05/08/2024; 0750 hrs.   Primary Care Physician: Harvey Gaetana CROME, NP Location: [facility] Note by: Nurse Marcelino, MD (TTS  technology used. I apologize for any typographical errors that were not detected and  corrected.) Date: 05/08/2024; Time: 8:55 AM  Disclaimer:  Medicine is not an Visual merchandiser. The only guarantee in medicine is that nothing is guaranteed. It is important to note that the decision to proceed with this intervention was based on the information collected from the patient. The Data and conclusions were drawn from the patient's questionnaire, the interview, and the physical examination. Because the information was provided in large part by the patient, it cannot be guaranteed that it has not been purposely or unconsciously manipulated. Every effort has been made to obtain as much relevant data as possible for this evaluation. It is important to note that the conclusions that lead to this procedure are derived in large part from the available data. Always take into account that the treatment will also be dependent on availability of resources and existing treatment guidelines, considered by other Pain Management Practitioners as being common knowledge and practice, at the time of the intervention. For Medico-Legal purposes, it is also important to point out that variation in procedural techniques and pharmacological choices are the acceptable norm. The indications, contraindications, technique, and results of the above procedure should only be interpreted and judged by a Board-Certified Interventional Pain Specialist with extensive familiarity and expertise in the same exact procedure and technique.

## 2024-05-08 NOTE — Patient Instructions (Signed)

## 2024-05-08 NOTE — Telephone Encounter (Signed)
 Patient Notified

## 2024-05-08 NOTE — Progress Notes (Signed)
 Safety precautions to be maintained throughout the outpatient stay will include: orient to surroundings, keep bed in low position, maintain call bell within reach at all times, provide assistance with transfer out of bed and ambulation.

## 2024-05-08 NOTE — Progress Notes (Signed)
 PROVIDER NOTE: Interpretation of information contained herein should be left to medically-trained personnel. Specific patient instructions are provided elsewhere under Patient Instructions section of medical record. This document was created in part using STT-dictation technology, any transcriptional errors that may result from this process are unintentional.  Patient: Marilyn Hendricks Type: Established DOB: 1976/02/05 MRN: [id number] PCP: Harvey Gaetana CROME, NP  Service: Procedure DOS: 05/08/2024 Setting: Ambulatory Location: Ambulatory outpatient facility Delivery: Face-to-face Provider: Wallie Sherry, MD Specialty: Interventional Pain Management Specialty designation: 09 Location: Outpatient facility Ref. Prov.: Sherry Wallie, MD       Interventional Therapy   Procedure: Cervical Facet Medial Branch Block(s) #1  Laterality: Right  Level: C4, C5, and C6 Medial Branch Level(s). Injecting these levels blocks the C4-5 and C5-6 cervical facet joints.  Imaging: Fluoroscopic guidance Anesthesia: Local anesthesia (1-2% Lidocaine ) Sedation: Minimal Sedation                       DOS: 05/08/2024  Performed by: Wallie Sherry, MD  Purpose: Diagnostic/Therapeutic Indications: Cervicalgia (cervical spine axial pain) severe enough to impact quality of life or function. 1. Cervical facet joint syndrome   2. Occipital neuralgia of right side   3. Chronic pain syndrome    NAS-11 Pain score:   Pre-procedure: 5 /10   Post-procedure: 4 /10     Position / Prep / Materials:  Position: Prone. Head in cradle. C-spine slightly flexed. Prep solution: ChloraPrep (2% chlorhexidine gluconate and 70% isopropyl alcohol) Prep Area: Posterior Cervico-thoracic Region. From occipital ridge to tip of scapula, and from shoulder to shoulder. Entire posterior and lateral neck surface. Materials:  Tray: Block Needle(s):  Type: Spinal  Gauge (G): 22  Length: 3.5-in  Qty:  2     H&P (Pre-op Assessment):  Marilyn Hendricks is a 48 y.o. (year old), female patient, seen today for interventional treatment. She  has a past surgical history that includes Tubal ligation and Cholecystectomy. Marilyn Hendricks has a current medication list which includes the following prescription(s): sumatriptan , turpentine, acetaminophen , albuterol , fluticasone, gabapentin, ibuprofen , semaglutide-weight management, aerochamber mv, and ubrelvy , and the following Facility-Administered Medications: lactated ringers . Her primarily concern today is the Neck Pain  Initial Vital Signs:  Pulse/HCG Rate: 79ECG Heart Rate: 77 Temp: (!) 97.3 F (36.3 C) Resp: 16 BP: 114/84 SpO2: 99 %  BMI: Estimated body mass index is 28.63 kg/m as calculated from the following:   Height as of this encounter: 5' 1.5 (1.562 m).   Weight as of this encounter: 154 lb (69.9 kg).  Risk Assessment: Allergies: Reviewed. She is allergic to doxycycline , beeswax, carrot [daucus carota], cats claw [uncaria tomentosa (cats claw)], erythromycin, and prednisone .  Allergy Precautions: None required Coagulopathies: Reviewed. None identified.  Blood-thinner therapy: None at this time Active Infection(s): Reviewed. None identified. Marilyn Hendricks is afebrile  Site Confirmation: Marilyn Hendricks was asked to confirm the procedure and laterality before marking the site Procedure checklist: Completed Consent: Before the procedure and under the influence of no sedative(s), amnesic(s), or anxiolytics, the patient was informed of the treatment options, risks and possible complications. To fulfill our ethical and legal obligations, as recommended by the American Medical Association's Code of Ethics, I have informed the patient of my clinical impression; the nature and purpose of the treatment or procedure; the risks, benefits, and possible complications of the intervention; the alternatives, including doing nothing; the risk(s) and benefit(s) of the alternative treatment(s) or procedure(s); and  the risk(s) and benefit(s) of doing nothing. The patient  was provided information about the general risks and possible complications associated with the procedure. These may include, but are not limited to: failure to achieve desired goals, infection, bleeding, organ or nerve damage, allergic reactions, paralysis, and death. In addition, the patient was informed of those risks and complications associated to Spine-related procedures, such as failure to decrease pain; infection (i.e.: Meningitis, epidural or intraspinal abscess); bleeding (i.e.: epidural hematoma, subarachnoid hemorrhage, or any other type of intraspinal or peri-dural bleeding); organ or nerve damage (i.e.: Any type of peripheral nerve, nerve root, or spinal cord injury) with subsequent damage to sensory, motor, and/or autonomic systems, resulting in permanent pain, numbness, and/or weakness of one or several areas of the body; allergic reactions; (i.e.: anaphylactic reaction); and/or death. Furthermore, the patient was informed of those risks and complications associated with the medications. These include, but are not limited to: allergic reactions (i.e.: anaphylactic or anaphylactoid reaction(s)); adrenal axis suppression; blood sugar elevation that in diabetics may result in ketoacidosis or comma; water retention that in patients with history of congestive heart failure may result in shortness of breath, pulmonary edema, and decompensation with resultant heart failure; weight gain; swelling or edema; medication-induced neural toxicity; particulate matter embolism and blood vessel occlusion with resultant organ, and/or nervous system infarction; and/or aseptic necrosis of one or more joints. Finally, the patient was informed that Medicine is not an exact science; therefore, there is also the possibility of unforeseen or unpredictable risks and/or possible complications that may result in a catastrophic outcome. The patient indicated having  understood very clearly. We have given the patient no guarantees and we have made no promises. Enough time was given to the patient to ask questions, all of which were answered to the patient's satisfaction. Ms. Lottman has indicated that she wanted to continue with the procedure. Attestation: I, the ordering provider, attest that I have discussed with the patient the benefits, risks, side-effects, alternatives, likelihood of achieving goals, and potential problems during recovery for the procedure that I have provided informed consent. Date  Time: 05/08/2024  7:49 AM  Pre-Procedure Preparation:  Monitoring: As per clinic protocol. Respiration, ETCO2, SpO2, BP, heart rate and rhythm monitor placed and checked for adequate function Safety Precautions: Patient was assessed for positional comfort and pressure points before starting the procedure. Time-out: I initiated and conducted the Time-out before starting the procedure, as per protocol. The patient was asked to participate by confirming the accuracy of the Time Out information. Verification of the correct person, site, and procedure were performed and confirmed by me, the nursing staff, and the patient. Time-out conducted as per Joint Commission's Universal Protocol (UP.01.01.01). Time: 0827 Start Time: 0827 hrs.  Description/Narrative of Procedure:          Laterality: See above. Targeted Levels: See above.  Rationale (medical necessity): procedure needed and proper for the diagnosis and/or treatment of the patient's medical symptoms and needs. Procedural Technique Safety Precautions: Aspiration looking for blood return was conducted prior to all injections. At no point did we inject any substances, as a needle was being advanced. No attempts were made at seeking any paresthesias. Safe injection practices and needle disposal techniques used. Medications properly checked for expiration dates. SDV (single dose vial) medications  used. Description of the Procedure: Protocol guidelines were followed. The patient was assisted into a comfortable position. The target area was identified and the area prepped in the usual manner. Skin & deeper tissues infiltrated with local anesthetic. Appropriate amount of time allowed to pass for  local anesthetics to take effect. The procedure needles were then advanced to the target area. Proper needle placement secured. Negative aspiration confirmed. Solution injected in intermittent fashion, asking for systemic symptoms every 0.5cc of injectate. The needles were then removed and the area cleansed, making sure to leave some of the prepping solution back to take advantage of its long term bactericidal properties.  Technical description of process:   C4 Medial Branch Nerve Block (MBB): The target area for the C4 dorsal medial articular branch is the lateral concave waist of the articular pillar of C4. Under fluoroscopic guidance, a Quincke needle was inserted until contact was made with os over the postero-lateral aspect of the articular pillar of C4 (target area). After negative aspiration for blood, 2 mL of the nerve block solution was injected without difficulty or complication. The needle was removed intact. C5 Medial Branch Nerve Block (MBB): The target area for the C5 dorsal medial articular branch is the lateral concave waist of the articular pillar of C5. Under fluoroscopic guidance, a Quincke needle was inserted until contact was made with os over the postero-lateral aspect of the articular pillar of C5 (target area). After negative aspiration for blood, 2 mL of the nerve block solution was injected without difficulty or complication. The needle was removed intact. C6 Medial Branch Nerve Block (MBB): The target area for the C6 dorsal medial articular branch is the lateral concave waist of the articular pillar of C6. Under fluoroscopic guidance, a Quincke needle was inserted until contact was made  with os over the postero-lateral aspect of the articular pillar of C6 (target area). After negative aspiration for blood, 2mL of the nerve block solution was injected without difficulty or complication. The needle was removed intact.   Once the entire procedure was completed, the treated area was cleaned, making sure to leave some of the prepping solution back to take advantage of its long term bactericidal properties.  Anatomy Reference Guide:      Facet Joint Innervation  C1-2 Third occipital Nerve (TON)  C2-3 TON, C3  Medial Branch  C3-4 C3, C4                     C4-5 C4, C5                     C5-6 C5, C6                     C6-7 C6, C7                     C7-T1 C7, C8                      Cervical Facet Pain Pattern overlap:   Vitals:   05/08/24 0829 05/08/24 0834 05/08/24 0836 05/08/24 0841  BP: (!) 107/93 (!) 109/91 97/86 102/81  Pulse:    79  Resp: (!) 30 18 19 16   Temp:      SpO2: 97% 98% 97% 100%  Weight:      Height:         Start Time: 0827 hrs. End Time: 0835 hrs.  Imaging Guidance (Spinal):         Type of Imaging Technique: Fluoroscopy Guidance (Spinal) Indication(s): Fluoroscopy guidance for needle placement to enhance accuracy in procedures requiring precise needle localization for targeted delivery of medication in or near specific anatomical locations not easily accessible without such real-time imaging assistance.  Exposure Time: Please see nurses notes. Contrast: None used. Fluoroscopic Guidance: I was personally present during the use of fluoroscopy. Tunnel Vision Technique used to obtain the best possible view of the target area. Parallax error corrected before commencing the procedure. Direction-depth-direction technique used to introduce the needle under continuous pulsed fluoroscopy. Once target was reached, antero-posterior, oblique, and lateral fluoroscopic projection used confirm needle placement in all planes. Images permanently  stored in EMR. Interpretation: No contrast injected. I personally interpreted the imaging intraoperatively. Adequate needle placement confirmed in multiple planes. Permanent images saved into the patient's record.  Post-operative Assessment:  Post-procedure Vital Signs:  Pulse/HCG Rate: 7981 Temp: (!) 97.3 F (36.3 C) Resp: 16 BP: 102/81 SpO2: 100 %  EBL: None  Complications: No immediate post-treatment complications observed by team, or reported by patient.  Note: The patient tolerated the entire procedure well. A repeat set of vitals were taken after the procedure and the patient was kept under observation following institutional policy, for this type of procedure. Post-procedural neurological assessment was performed, showing return to baseline, prior to discharge. The patient was provided with post-procedure discharge instructions, including a section on how to identify potential problems. Should any problems arise concerning this procedure, the patient was given instructions to immediately contact us , at any time, without hesitation. In any case, we plan to contact the patient by telephone for a follow-up status report regarding this interventional procedure.  Comments:  No additional relevant information.  Plan of Care (POC)  Orders:  Orders Placed This Encounter  Procedures   DG PAIN CL[facility] C-ARM 1-60 MIN NO REPORT    Intraoperative interpretation by procedural physician at [facility].    Standing Status:   Standing    Number of Occurrences:   1    Reason for exam::   Assistance in needle guidance and placement for procedures requiring needle placement in or near specific anatomical locations not easily accessible without such assistance.     Medications ordered for procedure: Meds ordered this encounter  Medications   lidocaine  (XYLOCAINE ) 2 % (with pres) injection 400 mg   lactated ringers  infusion   midazolam  (VERSED ) injection 0.5-2 mg    Make sure  Flumazenil is available in the pyxis when using this medication. If oversedation occurs, administer 0.2 mg IV over 15 sec. If after 45 sec no response, administer 0.2 mg again over 1 min; may repeat at 1 min intervals; not to exceed 4 doses (1 mg)   ropivacaine  (PF) 2 mg/mL (0.2%) (NAROPIN ) injection 18 mL   dexamethasone  (DECADRON ) injection 20 mg   Medications administered: We administered lidocaine , lactated ringers , midazolam , ropivacaine  (PF) 2 mg/mL (0.2%), and dexamethasone .  See the medical record for exact dosing, route, and time of administration.   Follow-up plan:   Return in about 4 weeks (around 06/05/2024) for PPE F2F .     Recent Visits Date Type Provider Dept  04/25/24 Office Visit Patel, Seema K, NP [facility]  03/27/24 Procedure visit Marcelino Nurse, MD [facility]  02/15/24 Office Visit Marcelino Nurse, MD [facility]  Showing recent visits within past 90 days and meeting all other requirements Today's Visits Date Type Provider Dept  05/08/24 Procedure visit Marcelino Nurse, MD [facility]  Showing today's visits and meeting all other requirements Future Appointments Date Type Provider Dept  06/06/24 Appointment Marcelino Nurse, MD [facility]  Showing future appointments within next 90 days and meeting all other requirements   Disposition: Discharge home  Discharge (Date  Time): 05/08/2024; 0750 hrs.   Primary Care Physician: Harvey Gaetana CROME, NP Location: [facility] Note by: Wallie Sherry, MD (TTS technology used. I apologize for any typographical errors that were not detected and corrected.) Date: 05/08/2024; Time: 8:55 AM  Disclaimer:  Medicine is not an Visual merchandiser. The only guarantee in medicine is that nothing is guaranteed. It is important to note that the decision to proceed with this intervention was based on the information collected from the patient. The Data and conclusions  were drawn from the patient's questionnaire, the interview, and the physical examination. Because the information was provided in large part by the patient, it cannot be guaranteed that it has not been purposely or unconsciously manipulated. Every effort has been made to obtain as much relevant data as possible for this evaluation. It is important to note that the conclusions that lead to this procedure are derived in large part from the available data. Always take into account that the treatment will also be dependent on availability of resources and existing treatment guidelines, considered by other Pain Management Practitioners as being common knowledge and practice, at the time of the intervention. For Medico-Legal purposes, it is also important to point out that variation in procedural techniques and pharmacological choices are the acceptable norm. The indications, contraindications, technique, and results of the above procedure should only be interpreted and judged by a Board-Certified Interventional Pain Specialist with extensive familiarity and expertise in the same exact procedure and technique.

## 2024-05-09 ENCOUNTER — Telehealth: Payer: Self-pay | Admitting: *Deleted

## 2024-05-09 NOTE — Telephone Encounter (Signed)
 Post procedure call;  patient reports that we did right side of her head on yesterday and now left side is hurting asking for order to have that side injected.  Told patient that she can talk with provider at her f/up appt.

## 2024-05-23 ENCOUNTER — Ambulatory Visit: Admitting: Student in an Organized Health Care Education/Training Program

## 2024-06-05 ENCOUNTER — Encounter: Payer: Self-pay | Admitting: Nurse Practitioner

## 2024-06-05 ENCOUNTER — Ambulatory Visit: Attending: Nurse Practitioner | Admitting: Nurse Practitioner

## 2024-06-05 VITALS — BP 114/76 | HR 88 | Temp 97.2°F | Resp 18 | Ht 61.5 in | Wt 168.0 lb

## 2024-06-05 DIAGNOSIS — M5481 Occipital neuralgia: Secondary | ICD-10-CM | POA: Insufficient documentation

## 2024-06-05 DIAGNOSIS — M47812 Spondylosis without myelopathy or radiculopathy, cervical region: Secondary | ICD-10-CM | POA: Insufficient documentation

## 2024-06-05 DIAGNOSIS — G894 Chronic pain syndrome: Secondary | ICD-10-CM | POA: Insufficient documentation

## 2024-06-05 DIAGNOSIS — M47816 Spondylosis without myelopathy or radiculopathy, lumbar region: Secondary | ICD-10-CM | POA: Insufficient documentation

## 2024-06-05 DIAGNOSIS — M7918 Myalgia, other site: Secondary | ICD-10-CM | POA: Insufficient documentation

## 2024-06-05 DIAGNOSIS — M5412 Radiculopathy, cervical region: Secondary | ICD-10-CM | POA: Insufficient documentation

## 2024-06-05 DIAGNOSIS — M5416 Radiculopathy, lumbar region: Secondary | ICD-10-CM | POA: Insufficient documentation

## 2024-06-05 NOTE — Patient Instructions (Signed)
 ______________________________________________________________________    Preparing for your procedure  Appointments: If you think you may not be able to keep your appointment, call 24-48 hours in advance to cancel. We need time to make it available to others.  Procedure visits are for procedures only. During your procedure appointment there will be: NO Prescription Refills*. NO medication changes or discussions*. NO discussion of disability issues*. NO unrelated pain problem evaluations*. NO evaluations to order other pain procedures*. *These will be addressed at a separate and distinct evaluation encounter on the provider's evaluation schedule and not during procedure days.  Instructions: Food intake: Avoid eating anything solid for at least 8 hours prior to your procedure. Clear liquid intake: You may take clear liquids such as water up to 2 hours prior to your procedure. (No carbonated drinks. No soda.) Transportation: Unless otherwise stated by your physician, bring a driver. (Driver cannot be a Market researcher, Pharmacist, community, or any other form of public transportation.) Morning Medicines: Except for blood thinners, take all of your other morning medications with a sip of water. Make sure to take your heart and blood pressure medicines. If your blood pressure's lower number is above 100, the case will be rescheduled. Blood thinners: Make sure to stop your blood thinners as instructed.  If you take a blood thinner, but were not instructed to stop it, call our office [phone] and ask to talk to a nurse. Not stopping a blood thinner prior to certain procedures could lead to serious complications. Diabetics on insulin: Notify the staff so that you can be scheduled 1st case in the morning. If your diabetes requires high dose insulin, take only  of your normal insulin dose the morning of the procedure and notify the staff that you have done so. Preventing infections: Shower with an antibacterial soap the  morning of your procedure.  Build-up your immune system: Take 1000 mg of Vitamin C with every meal (3 times a day) the day prior to your procedure. Antibiotics: Inform the nursing staff if you are taking any antibiotics or if you have any conditions that may require antibiotics prior to procedures. (Example: recent joint implants)   Pregnancy: If you are pregnant make sure to notify the nursing staff. Not doing so may result in injury to the fetus, including death.  Sickness: If you have a cold, fever, or any active infections, call and cancel or reschedule your procedure. Receiving steroids while having an infection may result in complications. Arrival: You must be in the facility at least 30 minutes prior to your scheduled procedure. Tardiness: Your scheduled time is also the cutoff time. If you do not arrive at least 15 minutes prior to your procedure, you will be rescheduled.  Children: Do not bring any children with you. Make arrangements to keep them home. Dress appropriately: There is always a possibility that your clothing may get soiled. Avoid long dresses. Valuables: Do not bring any jewelry or valuables.  Reasons to call and reschedule or cancel your procedure: (Following these recommendations will minimize the risk of a serious complication.) Surgeries: Avoid having procedures within 2 weeks of any surgery. (Avoid for 2 weeks before or after any surgery). Flu Shots: Avoid having procedures within 2 weeks of a flu shots or . (Avoid for 2 weeks before or after immunizations). Barium: Avoid having a procedure within 7-10 days after having had a radiological study involving the use of radiological contrast. (Myelograms, Barium swallow or enema study). Heart attacks: Avoid any elective procedures or surgeries for the  initial 6 months after a Myocardial Infarction (Heart Attack). Blood thinners: It is imperative that you stop these medications before procedures. Let us  know if you if you take  any blood thinner.  Infection: Avoid procedures during or within two weeks of an infection (including chest colds or gastrointestinal problems). Symptoms associated with infections include: Localized redness, fever, chills, night sweats or profuse sweating, burning sensation when voiding, cough, congestion, stuffiness, runny nose, sore throat, diarrhea, nausea, vomiting, cold or Flu symptoms, recent or current infections. It is specially important if the infection is over the area that we intend to treat. Heart and lung problems: Symptoms that may suggest an active cardiopulmonary problem include: cough, chest pain, breathing difficulties or shortness of breath, dizziness, ankle swelling, uncontrolled high or unusually low blood pressure, and/or palpitations. If you are experiencing any of these symptoms, cancel your procedure and contact your primary care physician for an evaluation.  Remember:  Regular Business hours are:  Monday to Thursday 8:00 AM to 4:00 PM  Provider's Schedule: Eric Como, MD:  Procedure days: Tuesday and Thursday 7:30 AM to 4:00 PM  Wallie Sherry, MD:  Procedure days: Monday and Wednesday 7:30 AM to 4:00 PM Last  Updated: 09/07/2023 ______________________________________________________________________     ______________________________________________________________________    Blood Thinners  IMPORTANT NOTICE:  If you take any of these, make sure to notify the nursing staff.  Failure to do so may result in serious injury.  Recommended time intervals to stop and restart blood-thinners, before & after invasive procedures  Generic Name Brand Name Pre-procedure: Stop medication for this amount of time before your procedure: Post-procedure: Wait this amount of time after the procedure before restarting your medication:  Abciximab Reopro 15 days 2 hrs  Alteplase Activase 10 days 10 days  Anagrelide Agrylin    Apixaban Eliquis 3 days 6 hrs  Cilostazol Pletal  3 days 5 hrs  Clopidogrel Plavix 7-10 days 2 hrs  Dabigatran Pradaxa 5 days 6 hrs  Dalteparin Fragmin 24 hours 4 hrs  Dipyridamole Aggrenox 11days 2 hrs  Edoxaban Lixiana; Savaysa 3 days 2 hrs  Enoxaparin  Lovenox 24 hours 4 hrs  Eptifibatide Integrillin 8 hours 2 hrs  Fondaparinux  Arixtra 72 hours 12 hrs  Hydroxychloroquine Plaquenil 11 days   Prasugrel Effient 7-10 days 6 hrs  Reteplase Retavase 10 days 10 days  Rivaroxaban Xarelto 3 days 6 hrs  Ticagrelor Brilinta 5-7 days 6 hrs  Ticlopidine Ticlid 10-14 days 2 hrs  Tinzaparin Innohep 24 hours 4 hrs  Tirofiban Aggrastat 8 hours 2 hrs  Warfarin Coumadin 5 days 2 hrs   Other medications with blood-thinning effects  NOTE: Consider stopping these if you have prolonged bleeding despite not taking any of the above blood thinners. Otherwise ask your provider and this will be decided on a case-by-case basis.  Product indications Generic (Brand) names Note  Cholesterol Lipitor Stop 4 days before procedure  Blood thinner (injectable) Heparin (LMW or LMWH Heparin) Stop 24 hours before procedure  Cancer Ibrutinib (Imbruvica) Stop 7 days before procedure  Malaria/Rheumatoid Hydroxychloroquine (Plaquenil) Stop 11 days before procedure  Thrombolytics  10 days before or after procedures   Over-the-counter (OTC) Products with blood-thinning effects  NOTE: Consider stopping these if you have prolonged bleeding despite not taking any of the above blood thinners. Otherwise ask your provider and this will be decided on a case-by-case basis.  Product Common names Stop Time  Aspirin > 325 mg Goody Powders, Excedrin, etc. 11 days  Aspirin <= 81  mg  7 days  Fish oil  4 days  Garlic supplements  7 days  Ginkgo biloba  36 hours  Ginseng  24 hours  NSAIDs Ibuprofen , Naprosyn, etc. 3 days  Vitamin E  4 days   ______________________________________________________________________      ______________________________________________________________________    Procedure instructions  Stop blood-thinners  Do not eat or drink fluids (other than water) for 6 hours before your procedure  No water for 2 hours before your procedure  Take your blood pressure medicine with a sip of water  Arrive 30 minutes before your appointment  If sedation is planned, bring suitable driver. [place], [place], & public transportation are NOT APPROVED)  Carefully read the Preparing for your procedure detailed instructions  If you have questions call us  at [phone]  Procedure appointments are for procedures only.   NO medication refills or new problem evaluations will be done on procedure days.   Only the scheduled, pre-approved procedure and side will be done.   ______________________________________________________________________

## 2024-06-05 NOTE — Progress Notes (Signed)
 Safety precautions to be maintained throughout the outpatient stay will include: orient to surroundings, keep bed in low position, maintain call bell within reach at all times, provide assistance with transfer out of bed and ambulation.

## 2024-06-05 NOTE — Progress Notes (Signed)
 PROVIDER NOTE: Interpretation of information contained herein should be left to medically-trained personnel. Specific patient instructions are provided elsewhere under Patient Instructions section of medical record. This document was created in part using AI and STT-dictation technology, any transcriptional errors that may result from this process are unintentional.  Patient: Marilyn Hendricks  Service: E/M   PCP: Harvey Gaetana CROME, NP  DOB: 1976-02-14  DOS: 06/05/2024  Provider: Emmy MARLA Blanch, NP  MRN: [id number]  Delivery: Face-to-face  Specialty: Interventional Pain Management  Type: Established Patient  Setting: Ambulatory outpatient facility  Specialty designation: 09  Referring Prov.: Harvey Gaetana CROME, NP  Location: Outpatient office facility       HPI  Marilyn Hendricks, a 48 y.o. year old female, is here today because of her Lumbar radicular pain [M54.16]. Marilyn Hendricks primary complain today is Back Pain  Pertinent problems: Marilyn Hendricks has Lumbar spondylosis; Cervical facet joint syndrome; Cervical radicular pain; and Chronic pain syndrome on their pertinent problem list.  Pain Assessment: Severity of Chronic pain is reported as a 10-Worst pain ever/10. Location: Back Lower/Radiates down bilateral legs, feet and toes. Onset: More than a month ago. Quality: Aching, Burning, Constant, Numbness, Sharp. Timing: Constant. Modifying factor(s): procedure. Vitals:  height is 5' 1.5 (1.562 m) and weight is 168 lb (76.2 kg). Her temporal temperature is 97.2 F (36.2 C) (abnormal). Her blood pressure is 114/76 and her pulse is 88. Her respiration is 18.  BMI: Estimated body mass index is 31.23 kg/m as calculated from the following:   Height as of this encounter: 5' 1.5 (1.562 m).   Weight as of this encounter: 168 lb (76.2 kg). Last encounter: 05/09/2024.  Reason for encounter: post-procedure evaluation and assessment.   Marilyn Hendricks underwent a diagnostic/therapeutic Cervical Facet Medial Branch  Block(s) and Greater, Occipital Nerve Block on May 08, 2024. She she reports initially 100% pain relief and functional improvement during local anesthetic phase followed sustained 80% pain relief and functional improvement since the procedure.  Procedure Procedure: Cervical Facet Medial Branch Block(s) #1  Laterality: Right  Level: C4, C5, and C6 Medial Branch Level(s). Injecting these levels blocks the C4-5 and C5-6 cervical facet joints.  Imaging: Fluoroscopic guidance Anesthesia: Local anesthesia (1-2% Lidocaine ) Sedation: Minimal Sedation                       DOS: 05/08/2024  Performed by: Wallie Sherry, MD   Purpose: Diagnostic/Therapeutic Indications: Cervicalgia (cervical spine axial pain) severe enough to impact quality of life or function. 1. Cervical facet joint syndrome   2. Occipital neuralgia of right side   3. Chronic pain syndrome     NAS-11 Pain score:        Pre-procedure: 5 /10        Post-procedure: 4 /10   Procedure Procedure:           Anesthesia, Analgesia, Anxiolysis:  Type: Diagnostic, Greater, Occipital Nerve Block  #1  Region: Posterolateral Cervical Level: Occipital Ridge   Laterality: Right   Anesthesia: Local (1-2% Lidocaine )  Anxiolysis: None  Sedation: None  Guidance: Anatomical landmarks             Position: Prone    1. Cervical facet joint syndrome   2. Occipital neuralgia of right side   3. Chronic pain syndrome     NAS-11 Pain score:        Pre-procedure: 5 /10        Post-procedure: 4 /10  Post-Procedure Evaluation    Effectiveness:  Initial hour after procedure: 60 % . Subsequent 4-6 hours post-procedure: 100 % . Analgesia past initial 6 hours: 80 % . Ongoing improvement:  Analgesic:  Marilyn Hendricks underwent a diagnostic/therapeutic Cervical Facet Medial Branch Block(s) and Greater, Occipital Nerve Block on May 08, 2024. She she reports initially 100% pain relief and functional improvement during local anesthetic phase  followed sustained 80% pain relief and functional improvement since the procedure. Function: Marilyn Hendricks reports improvement in function ROM: Marilyn Hendricks reports improvement in ROM   Pharmacotherapy Assessment   Monitoring: [place] PMP: PDMP not reviewed this encounter.       Pharmacotherapy: No side-effects or adverse reactions reported. Compliance: No problems identified. Effectiveness: Clinically acceptable.  Erlene Doyal SAUNDERS, [place]  06/05/2024  8:43 AM  Sign when Signing Visit Safety precautions to be maintained throughout the outpatient stay will include: orient to surroundings, keep bed in low position, maintain call bell within reach at all times, provide assistance with transfer out of bed and ambulation.     UDS:  No results found for: SUMMARY  No results found for: CBDTHCR No results found for: D8THCCBX No results found for: D9THCCBX  ROS  Constitutional: Denies any fever or chills Gastrointestinal: No reported hemesis, hematochezia, vomiting, or acute GI distress Musculoskeletal: Low back pain radiating down the posterior aspect of thigh down to bilateral legs (R>L) Neurological: No reported episodes of acute onset apraxia, aphasia, dysarthria, agnosia, amnesia, paralysis, loss of coordination, or loss of consciousness  Medication Review  AeroChamber MV, SUMAtriptan , [place], Ubrogepant , acetaminophen , albuterol , amoxicillin -clavulanate, fluticasone, gabapentin, ibuprofen , and semaglutide-weight management  History Review  Allergy: Marilyn Hendricks is allergic to doxycycline , beeswax, carrot [daucus carota], cats claw [uncaria tomentosa (cats claw)], erythromycin, and prednisone . Drug: Marilyn Hendricks  reports current drug use. Frequency: 7.00 times per week. Drug: Marijuana. Alcohol:  reports no history of alcohol use. Tobacco:  reports that she has been smoking cigarettes. She has a 13 pack-year smoking history. She has never used smokeless tobacco. Social: Marilyn Hendricks  reports  that she has been smoking cigarettes. She has a 13 pack-year smoking history. She has never used smokeless tobacco. She reports current drug use. Frequency: 7.00 times per week. Drug: Marijuana. She reports that she does not drink alcohol. Medical:  has a past medical history of ETD (eustachian tube dysfunction) and GERD (gastroesophageal reflux disease). Surgical: Marilyn Hendricks  has a past surgical history that includes Tubal ligation and Cholecystectomy. Family: family history includes Asthma in her mother; Breast cancer (age of onset: 67) in her mother; Cancer in her maternal grandfather; Esophagitis in her father.  Laboratory Chemistry Profile   Renal Lab Results  Component Value Date   BUN 13 11/02/2022   CREATININE 0.87 11/02/2022   GFRAA >60 10/29/2019   GFRNONAA >60 11/02/2022    Hepatic Lab Results  Component Value Date   AST 28 11/02/2022   ALT 51 (H) 11/02/2022   ALBUMIN 4.0 11/02/2022   ALKPHOS 75 11/02/2022   LIPASE 18 10/29/2019    Electrolytes Lab Results  Component Value Date   NA 135 11/02/2022   K 3.9 11/02/2022   CL 100 11/02/2022   CALCIUM 9.0 11/02/2022    Bone No results found for: VD25OH, VD125OH2TOT, CI6874NY7, CI7874NY7, 25OHVITD1, 25OHVITD2, 25OHVITD3, TESTOFREE, TESTOSTERONE  Inflammation (CRP: Acute Phase) (ESR: Chronic Phase) Lab Results  Component Value Date   CRP 1.8 (H) 11/02/2022         Note: Above Lab results reviewed.  Recent Imaging Review  DG PAIN [facility] C-ARM 1-60 MIN NO REPORT Fluoro was used, but no Radiologist interpretation will be provided.  Please refer to NOTES tab for provider progress note. Note: Reviewed        Physical Exam  Vitals: BP 114/76 (BP Location: Right Arm, Patient Position: Sitting)   Pulse 88   Temp (!) 97.2 F (36.2 C) (Temporal)   Resp 18   Ht 5' 1.5 (1.562 m)   Wt 168 lb (76.2 kg)   LMP 08/08/2021 (Approximate)   BMI 31.23 kg/m  BMI: Estimated body mass index is 31.23 kg/m as  calculated from the following:   Height as of this encounter: 5' 1.5 (1.562 m).   Weight as of this encounter: 168 lb (76.2 kg). Ideal: Ideal body weight: 48.9 kg (107 lb 14.6 oz) Adjusted ideal body weight: 59.9 kg (131 lb 15.2 oz) General appearance: Well nourished, well developed, and well hydrated. In no apparent acute distress Mental status: Alert, oriented x 3 (person, place, & time)       Respiratory: No evidence of acute respiratory distress Eyes: PERLA   Lumbar Exam  Skin & Axial Inspection: No masses, redness, or swelling Alignment: Symmetrical Functional ROM: Pain restricted ROM       Stability: No instability detected Muscle Tone/Strength: Functionally intact. No obvious neuro-muscular anomalies detected. Sensory (Neurological): Musculoskeletal pain pattern Palpation: No palpable anomalies       Provocative Tests: Hyperextension/rotation test: (+) bilaterally for facet joint pain. Lumbar quadrant test (Kemp's test): (+) bilaterally for facet joint pain. Lateral bending test: deferred today       Patrick's Maneuver: (+) for bilateral S-I arthralgia  FABER* test: (+) for bilateral S-I arthralgia  *(Flexion, ABduction and External Rotation)  Gait & Posture Assessment  Ambulation: Unassisted Gait: Relatively normal for age and body habitus Posture: WNL  Lower Extremity Exam      Side: Right lower extremity   Side: Left lower extremity  Stability: No instability observed           Stability: No instability observed          Skin & Extremity Inspection: Skin color, temperature, and hair growth are WNL. No peripheral edema or cyanosis. No masses, redness, swelling, asymmetry, or associated skin lesions. No contractures.   Skin & Extremity Inspection: Skin color, temperature, and hair growth are WNL. No peripheral edema or cyanosis. No masses, redness, swelling, asymmetry, or associated skin lesions. No contractures.  Functional ROM: Unrestricted ROM                    Functional ROM: Unrestricted ROM                  Muscle Tone/Strength: Functionally intact. No obvious neuro-muscular anomalies detected.   Muscle Tone/Strength: Functionally intact. No obvious neuro-muscular anomalies detected.  Sensory (Neurological): Unimpaired         Sensory (Neurological): Unimpaired        DTR: Patellar: deferred today Achilles: deferred today Plantar: deferred today   DTR: Patellar: deferred today Achilles: deferred today Plantar: deferred today  Palpation: No palpable anomalies   Palpation: No palpable anomalies    Assessment   Diagnosis Status  1. Lumbar radicular pain   2. Lumbar spondylosis   3. Chronic pain syndrome   4. Cervical facet joint syndrome   5. Occipital neuralgia of right side   6. Cervical radicular pain   7. Lumbar facet arthropathy   8. Myofascial pain syndrome of  lumbar spine    Persistent Persistent Persistent   Plan of Care  Assessment and Plan  Peripheral nerve stimulator (PNS): The patient has a chronic low back pain with adequate relief from conservative treatments.  We discussed the option of peripheral nerve stimulator, which involves a minimally invasive procedure with the placement of a thin wire like electrode next to her peripheral nerve.  This device delivers electrical pulses to the targeted nerve, thereby interrupting pain signals before they reach the brain.  Plan: Psych evaluation for Peripheral nerve stimulator (PNS)  Schedule : (ECT) (R) Peripheral nerve stimulator (PNS) with Dr. Marcelino    Pharmacotherapy (Medications Ordered): No orders of the defined types were placed in this encounter.  Orders:  Orders Placed This Encounter  Procedures   Implantable Peripheral Nerve Stimulator    Standing Status:   Future    Expiration Date:   09/04/2024    Scheduling Instructions:     Procedure: Temporary implantable peripheral nerve stimulator     Side: Right-sided     Nerve site: L4-L5     Sedation: Patient's  choice.     Timeframe: ASAA    Location of Procedure:   [facility]   Ambulatory referral to Psychology    Referral Priority:   Routine    Referral Type:   Psychiatric    Referral Reason:   Specialty Services Required    Requested Specialty:   Psychology    Number of Visits Requested:   1   Follow-up plan:   Return for (ECT) (R) Peripheral nerve stimulator (PNS) with Dr. Marcelino .    Recent Visits Date Type Provider Dept  05/08/24 Procedure visit Marcelino Nurse, MD [facility]  04/25/24 Office Visit Athony Coppa K, NP [facility]  03/27/24 Procedure visit Marcelino Nurse, MD [facility]  Showing recent visits within past 90 days and meeting all other requirements Today's Visits Date Type Provider Dept  06/05/24 Office Visit Markevion Lattin K, NP [facility]  Showing today's visits and meeting all other requirements Future Appointments No visits were found meeting these conditions. Showing future appointments within next 90 days and meeting all other requirements  I discussed the assessment and treatment plan with the patient. The patient was provided an opportunity to ask questions and all were answered. The patient agreed with the plan and demonstrated an understanding of the instructions.  Patient advised to call back or seek an in-person evaluation if the symptoms or condition worsens.  Duration of encounter: 30 minutes.  Total time on encounter, as per AMA guidelines included both the face-to-face and non-face-to-face time personally spent by the physician and/or other qualified health care professional(s) on the day of the encounter (includes time in activities that require the physician or other qualified health care professional and does not include time in activities normally performed by clinical staff). Physician's time may include the following activities when performed: Preparing to see the patient (e.g., pre-charting  review of records, searching for previously ordered imaging, lab work, and nerve conduction tests) Review of prior analgesic pharmacotherapies. Reviewing PMP Interpreting ordered tests (e.g., lab work, imaging, nerve conduction tests) Performing post-procedure evaluations, including interpretation of diagnostic procedures Obtaining and/or reviewing separately obtained history Performing a medically appropriate examination and/or evaluation Counseling and educating the patient/family/caregiver Ordering medications, tests, or procedures Referring and communicating with other health care professionals (when not separately reported) Documenting clinical information in the electronic or other health record Independently interpreting results (not separately  reported) and communicating results to the patient/ family/caregiver Care coordination (not separately reported)  Note by: Janes Colegrove K Maansi Wike, NP (TTS and AI technology used. I apologize for any typographical errors that were not detected and corrected.) Date: 06/05/2024; Time: 12:12 PM

## 2024-06-06 ENCOUNTER — Ambulatory Visit: Admitting: Nurse Practitioner

## 2024-06-12 DIAGNOSIS — E538 Deficiency of other specified B group vitamins: Secondary | ICD-10-CM | POA: Insufficient documentation

## 2024-06-12 DIAGNOSIS — E782 Mixed hyperlipidemia: Secondary | ICD-10-CM | POA: Insufficient documentation

## 2024-07-04 ENCOUNTER — Ambulatory Visit: Admitting: Student in an Organized Health Care Education/Training Program

## 2024-07-04 ENCOUNTER — Ambulatory Visit
Admission: RE | Admit: 2024-07-04 | Discharge: 2024-07-04 | Disposition: A | Discharge status: Home / Self Care | Source: Ambulatory Visit | Attending: Student in an Organized Health Care Education/Training Program | Admitting: Student in an Organized Health Care Education/Training Program

## 2024-07-04 VITALS — BP 142/98 | HR 99 | Temp 97.5°F | Resp 16 | Ht 61.5 in | Wt 165.0 lb

## 2024-07-04 DIAGNOSIS — G5703 Lesion of sciatic nerve, bilateral lower limbs: Secondary | ICD-10-CM | POA: Insufficient documentation

## 2024-07-04 DIAGNOSIS — M533 Sacrococcygeal disorders, not elsewhere classified: Secondary | ICD-10-CM | POA: Insufficient documentation

## 2024-07-04 DIAGNOSIS — M461 Sacroiliitis, not elsewhere classified: Secondary | ICD-10-CM | POA: Insufficient documentation

## 2024-07-04 NOTE — Progress Notes (Signed)
 Safety precautions to be maintained throughout the outpatient stay will include: orient to surroundings, keep bed in low position, maintain call bell within reach at all times, provide assistance with transfer out of bed and ambulation.

## 2024-07-04 NOTE — Progress Notes (Signed)
 PROVIDER NOTE: Interpretation of information contained herein should be left to medically-trained personnel. Specific patient instructions are provided elsewhere under Patient Instructions section of medical record. This document was created in part using AI and STT-dictation technology, any transcriptional errors that may result from this process are unintentional.  Patient: Marilyn Hendricks  Service: E/M   PCP: Harvey Gaetana CROME, NP  DOB: [birth year]  DOS: 07/04/2024  Provider: Wallie Sherry, MD  MRN: [id number]  Delivery: Face-to-face  Specialty: Interventional Pain Management  Type: Established Patient  Setting: Ambulatory outpatient facility  Specialty designation: 09  Referring Prov.: Harvey Gaetana CROME, NP  Location: Outpatient office facility       History of present illness (HPI) Marilyn Hendricks, a 48 y.o. year old female, is here today because of her Sacroiliac joint pain [M53.3]. Marilyn Hendricks primary complain today is Back Pain (lower)  Pertinent problems: Marilyn Hendricks has Lumbar spondylosis; Cervical facet joint syndrome; Cervical radicular pain; and Chronic pain syndrome on their pertinent problem list.  Pain Assessment: Severity of Chronic pain is reported as a 10-Worst pain ever/10. Location: Back Lower/radiates dwon back of legs to feet and feet- feet and toes cramp. Onset: More than a month ago. Quality: Aching, Burning, Constant, Sharp, Nagging, Discomfort. Timing:  (pinched nerved pinch). Modifying factor(s): rest, Nothing:SABRA Vitals:  height is 5' 1.5 (1.562 m) and weight is 165 lb (74.8 kg). Her temperature is 97.5 F (36.4 C) (abnormal). Her blood pressure is 142/98 (abnormal) and her pulse is 99. Her respiration is 16 and oxygen saturation is 99%.  BMI: Estimated body mass index is 30.67 kg/m as calculated from the following:   Height as of this encounter: 5' 1.5 (1.562 m).   Weight as of this encounter: 165 lb (74.8 kg).  Last encounter: 02/15/2024. Last procedure:  05/08/2024.  Reason for encounter:   History of Present Illness   Marilyn Hendricks is a 48 year old female who presents with severe lower back and buttock pain. This is localized over her lower back and bilateral SI joint.  She experiences severe cramps and pain in the lower back, specifically in a small rectangular area, which she describes as feeling like a pinched nerve. The pain is sore to the touch and radiates into the buttock area. No history of spine surgery is noted.  She has previously undergone ablations at the L3, L4, and L5 levels, which provided temporary relief. She describes the injections as 'marvelous' but notes that the relief is short-lived.      No results found for: D9THCCBX  ROS  Constitutional: Denies any fever or chills Gastrointestinal: No reported hemesis, hematochezia, vomiting, or acute GI distress Musculoskeletal: Bilateral SI joint and piriformis pain Neurological: No reported episodes of acute onset apraxia, aphasia, dysarthria, agnosia, amnesia, paralysis, loss of coordination, or loss of consciousness  Medication Review  AeroChamber MV, SUMAtriptan , [place]pent[place], Ubrogepant , acetaminophen , albuterol , fluticasone, gabapentin, ibuprofen , phentermine, rosuvastatin, and semaglutide-weight management  History Review  Allergy: Ms. Cham is allergic to doxycycline , beeswax, carrot [daucus carota], cats claw [uncaria tomentosa (cats claw)], erythromycin, and prednisone . Drug: Ms. Spainhower  reports current drug use. Frequency: 7.00 times per week. Drug: Marijuana. Alcohol:  reports no history of alcohol use. Tobacco:  reports that she has been smoking cigarettes. She has a 13 pack-year smoking history. She has never used smokeless tobacco. Social: Ms. Hauth  reports that she has been smoking cigarettes. She has a 13 pack-year smoking history. She has never used smokeless tobacco. She reports current  drug use. Frequency: 7.00 times per week. Drug: Marijuana. She  reports that she does not drink alcohol. Medical:  has a past medical history of ETD (eustachian tube dysfunction) and GERD (gastroesophageal reflux disease). Surgical: Ms. Ly  has a past surgical history that includes Tubal ligation and Cholecystectomy. Family: family history includes Asthma in her mother; Breast cancer (age of onset: 5) in her mother; Cancer in her maternal grandfather; Esophagitis in her father.  Laboratory Chemistry Profile   Renal Lab Results  Component Value Date   BUN 13 11/02/2022   CREATININE 0.87 11/02/2022   GFRAA >60 10/29/2019   GFRNONAA >60 11/02/2022    Hepatic Lab Results  Component Value Date   AST 28 11/02/2022   ALT 51 (H) 11/02/2022   ALBUMIN 4.0 11/02/2022   ALKPHOS 75 11/02/2022   LIPASE 18 10/29/2019    Electrolytes Lab Results  Component Value Date   NA 135 11/02/2022   K 3.9 11/02/2022   CL 100 11/02/2022   CALCIUM 9.0 11/02/2022    Bone No results found for: VD25OH, VD125OH2TOT, CI6874NY7, CI7874NY7, 25OHVITD1, 25OHVITD2, 25OHVITD3, TESTOFREE, TESTOSTERONE  Inflammation (CRP: Acute Phase) (ESR: Chronic Phase) Lab Results  Component Value Date   CRP 1.8 (H) 11/02/2022         Note: Above Lab results reviewed.  Recent Imaging Review  DG PAIN [facility] C-ARM 1-60 MIN NO REPORT Fluoro was used, but no Radiologist interpretation will be provided.  Please refer to NOTES tab for provider progress note. Note: Reviewed        Physical Exam  Vitals: BP (!) 142/98   Pulse 99   Temp (!) 97.5 F (36.4 C)   Resp 16   Ht 5' 1.5 (1.562 m)   Wt 165 lb (74.8 kg)   LMP 08/08/2021 (Approximate)   SpO2 99%   BMI 30.67 kg/m  BMI: Estimated body mass index is 30.67 kg/m as calculated from the following:   Height as of this encounter: 5' 1.5 (1.562 m).   Weight as of this encounter: 165 lb (74.8 kg). Ideal: Ideal body weight: 48.9 kg (107 lb 14.6 oz) Adjusted ideal body weight: 59.3 kg (130 lb 12 oz) General  appearance: Well nourished, well developed, and well hydrated. In no apparent acute distress Mental status: Alert, oriented x 3 (person, place, & time)       Respiratory: No evidence of acute respiratory distress Eyes: PERLA Physical Examination Findings: Positive Sacral Thrust (Sacral Spring, Downward Pressure): (Y) Positive FABER maneuver (Patrick's): (Y) Positive SI distraction (Gapping): (Y) Positive SI compression (Approximation): (Y) Positive Thigh Thrust:  (Y) Positive Gaenslen's: (Y) Positive Sacral Sulcus Tenderness: (Y)    Assessment   Diagnosis  1. Sacroiliac joint pain   2. SI joint arthritis   3. Piriformis syndrome of both sides      Updated Problems: Problem  Sacroiliac Joint Pain  Si Joint Arthritis  Piriformis Syndrome of Both Sides    Plan of Care  The patient presents with bilateral buttock pain and lower back pain with positive provocative maneuvers (e.g., FABER, thigh-thrust/compression-distraction, and piriformis stretch/tenderness), most consistent with sacroiliac (SI) joint dysfunction and concomitant piriformis syndrome. She has attempted home stretching without meaningful relief. We will proceed with diagnostic SI joint injections (bilateral, under fluoroscopy) to confirm the SI joint as a pain generator; if she experiences short-term relief with local anesthetic (>=50-75%), we will consider lateral branch radiofrequency neurotomy for longer-term benefit. In parallel, we recommend an piriformis injection (local anesthetic  corticosteroid) to address  myofascial/entrapment components. She is advised to focus on lumbopelvic stabilization, gluteal/hip external rotator strengthening, and piriformis and hamstring flexibility; consider an SI belt for symptomatic support and activity modification (avoid prolonged sitting, hip adduction/cross-legged postures).  We reviewed risks, benefits, and alternatives of injections (bleeding, infection, transient soreness, steroid  effects, rare nerve injury) and offered oral anxiolysis if needed. Follow-up in 2-4 weeks after injections to reassess pain and function; if response is suboptimal, we will re-evaluate for lumbar radiculopathy or hip pathology and adjust the plan accordingly.  Ms. Laquisha D Angello has a current medication list which includes the following long-term medication(s): albuterol , fluticasone, gabapentin, phentermine, rosuvastatin, and sumatriptan .  Pharmacotherapy (Medications Ordered): No orders of the defined types were placed in this encounter.  Orders:  Orders Placed This Encounter  Procedures   SACROILIAC JOINT INJECTION    Physical Examination Findings: Positive Sacral Thrust (Sacral Spring, Downward Pressure): (Y) Positive FABER maneuver (Patrick's): (Y) Positive SI distraction (Gapping): (Y) Positive SI compression (Approximation): (Y) Positive Thigh Thrust:  (Y) Positive Gaenslen's: (Y) Positive Sacral Sulcus Tenderness: (Y)    Standing Status:   Future    Expected Date:   07/19/2024    Expiration Date:   07/04/2025    Scheduling Instructions:     Procedure: Sacroiliac Joint Injection and piriformis syndrome     Side  Laterality: Bilateral     Sedation: Patient's choice.     Timeframe: As soon as schedule allows.    Where will this procedure be performed?:   [facility]   DG Si Joints    Standing Status:   Future    Expiration Date:   10/04/2024    Scheduling Instructions:     Please make sure that the patient understands that this needs to be done as soon as possible. Never have the patient do the imaging just before the next appointment. Inform patient that having the imaging done within the [facility] will expedite the availability of the results and will provide      imaging availability to the requesting physician. In addition inform the patient that the imaging order has an expiration date and will not be renewed if not done within the active period.    Reason for  Exam (SYMPTOM  OR DIAGNOSIS REQUIRED):   Sacroiliac joint pain    Is the patient pregnant?:   No    Preferred imaging location?:   [facility]    Call Results- Best Contact Number?:   [phone] [facility]    Release to patient:   Immediate     BL L3-L5 RFA 09/27/23, 03/27/24    Return in about 2 weeks (around 07/18/2024) for B/L SIJ and piriformis TPI, in clinic IV Versed .    Recent Visits Date Type Provider Dept  06/05/24 Office Visit Patel, Seema K, NP [facility]  05/08/24 Procedure visit Marcelino Nurse, MD [facility]  04/25/24 Office Visit Patel, Seema K, NP [facility]  Showing recent visits within past 90 days and meeting all other requirements Today's Visits Date Type Provider Dept  07/04/24 Office Visit Marcelino Nurse, MD [facility]  Showing today's visits and meeting all other requirements Future Appointments No visits were found meeting these conditions. Showing future appointments within next 90 days and meeting all other requirements  I discussed the assessment and treatment plan with the patient. The patient was provided an opportunity to ask questions and all were answered. The patient agreed  with the plan and demonstrated an understanding of the instructions.  Patient advised to call back or seek an in-person evaluation if the symptoms or condition worsens.  I personally spent a total of in the care of the patient today including preparing to see the patient, getting/reviewing separately obtained history, performing a medically appropriate exam/evaluation, counseling and educating, placing orders, and documenting clinical information in the EHR.   Note by: Wallie Sherry, MD (TTS and AI technology used. I apologize for any typographical errors that were not detected and corrected.) Date: 07/04/2024; Time: 9:07 AM

## 2024-07-04 NOTE — Patient Instructions (Signed)
 ______________________________________________________________________    Preparing for your procedure  Appointments: If you think you may not be able to keep your appointment, call 24-48 hours in advance to cancel. We need time to make it available to others.  Procedure visits are for procedures only. During your procedure appointment there will be: NO Prescription Refills*. NO medication changes or discussions*. NO discussion of disability issues*. NO unrelated pain problem evaluations*. NO evaluations to order other pain procedures*. *These will be addressed at a separate and distinct evaluation encounter on the provider's evaluation schedule and not during procedure days.  Instructions: Food intake: Avoid eating anything solid for at least 8 hours prior to your procedure. Clear liquid intake: You may take clear liquids such as water up to 2 hours prior to your procedure. (No carbonated drinks. No soda.) Transportation: Unless otherwise stated by your physician, bring a driver. (Driver cannot be a Market researcher, Pharmacist, community, or any other form of public transportation.) Morning Medicines: Except for blood thinners, take all of your other morning medications with a sip of water. Make sure to take your heart and blood pressure medicines. If your blood pressure's lower number is above 100, the case will be rescheduled. Blood thinners: Make sure to stop your blood thinners as instructed.  If you take a blood thinner, but were not instructed to stop it, call our office [phone] and ask to talk to a nurse. Not stopping a blood thinner prior to certain procedures could lead to serious complications. Diabetics on insulin: Notify the staff so that you can be scheduled 1st case in the morning. If your diabetes requires high dose insulin, take only  of your normal insulin dose the morning of the procedure and notify the staff that you have done so. Preventing infections: Shower with an antibacterial soap the  morning of your procedure.  Build-up your immune system: Take 1000 mg of Vitamin C with every meal (3 times a day) the day prior to your procedure. Antibiotics: Inform the nursing staff if you are taking any antibiotics or if you have any conditions that may require antibiotics prior to procedures. (Example: recent joint implants)   Pregnancy: If you are pregnant make sure to notify the nursing staff. Not doing so may result in injury to the fetus, including death.  Sickness: If you have a cold, fever, or any active infections, call and cancel or reschedule your procedure. Receiving steroids while having an infection may result in complications. Arrival: You must be in the facility at least 30 minutes prior to your scheduled procedure. Tardiness: Your scheduled time is also the cutoff time. If you do not arrive at least 15 minutes prior to your procedure, you will be rescheduled.  Children: Do not bring any children with you. Make arrangements to keep them home. Dress appropriately: There is always a possibility that your clothing may get soiled. Avoid long dresses. Valuables: Do not bring any jewelry or valuables.  Reasons to call and reschedule or cancel your procedure: (Following these recommendations will minimize the risk of a serious complication.) Surgeries: Avoid having procedures within 2 weeks of any surgery. (Avoid for 2 weeks before or after any surgery). Flu Shots: Avoid having procedures within 2 weeks of a flu shots or . (Avoid for 2 weeks before or after immunizations). Barium: Avoid having a procedure within 7-10 days after having had a radiological study involving the use of radiological contrast. (Myelograms, Barium swallow or enema study). Heart attacks: Avoid any elective procedures or surgeries for the  initial 6 months after a "Myocardial Infarction" (Heart Attack). Blood thinners: It is imperative that you stop these medications before procedures. Let us know if you if you take  any blood thinner.  Infection: Avoid procedures during or within two weeks of an infection (including chest colds or gastrointestinal problems). Symptoms associated with infections include: Localized redness, fever, chills, night sweats or profuse sweating, burning sensation when voiding, cough, congestion, stuffiness, runny nose, sore throat, diarrhea, nausea, vomiting, cold or Flu symptoms, recent or current infections. It is specially important if the infection is over the area that we intend to treat. Heart and lung problems: Symptoms that may suggest an active cardiopulmonary problem include: cough, chest pain, breathing difficulties or shortness of breath, dizziness, ankle swelling, uncontrolled high or unusually low blood pressure, and/or palpitations. If you are experiencing any of these symptoms, cancel your procedure and contact your primary care physician for an evaluation.  Remember:  Regular Business hours are:  Monday to Thursday 8:00 AM to 4:00 PM  Provider's Schedule: Delano Metz, MD:  Procedure days: Tuesday and Thursday 7:30 AM to 4:00 PM  Edward Jolly, MD:  Procedure days: Monday and Wednesday 7:30 AM to 4:00 PM Last  Updated: 09/07/2023 ______________________________________________________________________    Sacroiliac (SI) Joint Injection Patient Information  Description: The sacroiliac joint connects the scrum (very low back and tailbone) to the ilium (a pelvic bone which also forms half of the hip joint).  Normally this joint experiences very little motion.  When this joint becomes inflamed or unstable low back and or hip and pelvis pain may result.  Injection of this joint with local anesthetics (numbing medicines) and steroids can provide diagnostic information and reduce pain.  This injection is performed with the aid of x-ray guidance into the tailbone area while you are lying on your stomach.   You may experience an electrical sensation down the leg while this  is being done.  You may also experience numbness.  We also may ask if we are reproducing your normal pain during the injection.  Conditions which may be treated SI injection:  Low back, buttock, hip or leg pain  Preparation for the Injection:  Do not eat any solid food or dairy products within 8 hours of your appointment.  You may drink clear liquids up to 3 hours before appointment.  Clear liquids include water, black coffee, juice or soda.  No milk or cream please. You may take your regular medications, including pain medications with a sip of water before your appointment.  Diabetics should hold regular insulin (if take separately) and take 1/2 normal NPH dose the morning of the procedure.  Carry some sugar containing items with you to your appointment. A driver must accompany you and be prepared to drive you home after your procedure. Bring all of your current medications with you. An IV may be inserted and sedation may be given at the discretion of the physician. A blood pressure cuff, EKG and other monitors will often be applied during the procedure.  Some patients may need to have extra oxygen administered for a short period.  You will be asked to provide medical information, including your allergies, prior to the procedure.  We must know immediately if you are taking blood thinners (like Coumadin/Warfarin) or if you are allergic to IV iodine contrast (dye).  We must know if you could possible be pregnant.  Possible side effects:  Bleeding from needle site Infection (rare, may require surgery) Nerve injury (rare) Numbness &  tingling (temporary) A brief convulsion or seizure Light-headedness (temporary) Pain at injection site (several days) Decreased blood pressure (temporary) Weakness in the leg (temporary)   Call if you experience:  New onset weakness or numbness of an extremity below the injection site that last more than 8 hours. Hives or difficulty breathing ( go to the  emergency room) Inflammation or drainage at the injection site Any new symptoms which are concerning to you  Please note:  Although the local anesthetic injected can often make your back/ hip/ buttock/ leg feel good for several hours after the injections, the pain will likely return.  It takes 3-7 days for steroids to work in the sacroiliac area.  You may not notice any pain relief for at least that one week.  If effective, we will often do a series of three injections spaced 3-6 weeks apart to maximally decrease your pain.  After the initial series, we generally will wait some months before a repeat injection of the same type.  If you have any questions, please call [phone] [facility]

## 2024-07-13 ENCOUNTER — Telehealth: Payer: Self-pay

## 2024-07-13 NOTE — Telephone Encounter (Addendum)
 Her insurance denied auth for the [place]. They state this procedure is only approved if the patient is going to have a SI fusion. I notified the patient. She wanted to know if there was any other procedure she could get. She said the procedures you have done have helped her back pain.

## 2024-07-19 ENCOUNTER — Ambulatory Visit: Admitting: Student in an Organized Health Care Education/Training Program

## 2024-07-23 ENCOUNTER — Ambulatory Visit (INDEPENDENT_AMBULATORY_CARE_PROVIDER_SITE_OTHER)

## 2024-07-23 ENCOUNTER — Encounter: Payer: Self-pay | Admitting: Emergency Medicine

## 2024-07-23 ENCOUNTER — Ambulatory Visit
Admission: EM | Admit: 2024-07-23 | Discharge: 2024-07-23 | Disposition: A | Discharge status: Home / Self Care | Attending: Emergency Medicine | Admitting: Emergency Medicine

## 2024-07-23 DIAGNOSIS — J0140 Acute pansinusitis, unspecified: Secondary | ICD-10-CM

## 2024-07-23 DIAGNOSIS — R051 Acute cough: Secondary | ICD-10-CM

## 2024-07-23 DIAGNOSIS — J441 Chronic obstructive pulmonary disease with (acute) exacerbation: Secondary | ICD-10-CM | POA: Diagnosis not present

## 2024-07-23 DIAGNOSIS — Z760 Encounter for issue of repeat prescription: Secondary | ICD-10-CM

## 2024-07-23 MED ORDER — IPRATROPIUM-ALBUTEROL 0.5-2.5 (3) MG/3ML IN SOLN
3.0000 mL | Freq: Once | RESPIRATORY_TRACT | Status: AC
  Administered 2024-07-23: 3 mL via RESPIRATORY_TRACT

## 2024-07-23 MED ORDER — IPRATROPIUM BROMIDE 0.06 % NA SOLN: 2.0000 | Freq: Four times a day (QID) | NASAL | 0 refills | Status: AC

## 2024-07-23 MED ORDER — DEXAMETHASONE SOD PHOSPHATE PF 10 MG/ML IJ SOLN
10.0000 mg | Freq: Once | INTRAMUSCULAR | Status: AC
  Administered 2024-07-23: 10 mg via INTRAMUSCULAR

## 2024-07-23 MED ORDER — ALBUTEROL SULFATE (2.5 MG/3ML) 0.083% IN NEBU: 2.5000 mg | INHALATION_SOLUTION | RESPIRATORY_TRACT | 0 refills | Status: AC | PRN

## 2024-07-23 MED ORDER — AMOXICILLIN-POT CLAVULANATE 875-125 MG PO TABS: 1.0000 | ORAL_TABLET | Freq: Two times a day (BID) | ORAL | 0 refills | Status: DC

## 2024-07-23 MED ORDER — PROMETHAZINE-DM 6.25-15 MG/5ML PO SYRP: 5.0000 mL | ORAL_SOLUTION | Freq: Four times a day (QID) | ORAL | 0 refills | Status: AC | PRN

## 2024-07-23 MED ORDER — ALBUTEROL SULFATE HFA 108 (90 BASE) MCG/ACT IN AERS: 2.0000 | INHALATION_SPRAY | RESPIRATORY_TRACT | 0 refills | Status: AC | PRN

## 2024-07-23 MED ORDER — ALBUTEROL SULFATE (2.5 MG/3ML) 0.083% IN NEBU
2.5000 mg | INHALATION_SOLUTION | Freq: Once | RESPIRATORY_TRACT | Status: AC
  Administered 2024-07-23: 2.5 mg via RESPIRATORY_TRACT

## 2024-07-23 MED ORDER — AEROCHAMBER MV MISC
1 refills | Status: AC
Start: 2024-07-23 — End: ?

## 2024-07-23 MED ORDER — IBUPROFEN 600 MG PO TABS: 600.0000 mg | ORAL_TABLET | Freq: Three times a day (TID) | ORAL | 0 refills | Status: AC | PRN

## 2024-07-23 NOTE — ED Provider Notes (Signed)
 HPI  SUBJECTIVE:  Marilyn Hendricks is a 48 y.o. female who presents with 1 week of a respiratory illness.  Initially symptoms started as 4 to 5 days of fevers Tmax 102, bodyaches, headaches, nasal congestion, cough that got better and then returned.  She reports nasal congestion, rhinorrhea, sinus pain and pressure, upper dental pain, wheezing, chest soreness from coughing, shortness of breath, dyspnea on exertion.  States cough is now productive of increased amount of mucus.  No change in its color.  She is unable to sleep at night because of the cough.  No chest pressure or heaviness.  No fevers in several days.  No facial swelling.  She states that she was on antibiotics in the past 3 months for an otitis media-chart review has OM in April.   She has a past medical history of migraines, COPD, GERD, status post tubal ligation and cholecystectomy.  She is a smoker.  No history of diabetes, hypertension, chronic kidney disease.  [facility]  Past Medical History:  Diagnosis Date   ETD (eustachian tube dysfunction)    GERD (gastroesophageal reflux disease)     Past Surgical History:  Procedure Laterality Date   CHOLECYSTECTOMY     TUBAL LIGATION      Family History  Problem Relation Age of Onset   Breast cancer Mother 52       double mastectomy in 1994   Asthma Mother    Esophagitis Father    Cancer Maternal Grandfather        prostate    Social History   Tobacco Use   Smoking status: Every Day    Current packs/day: 0.50    Average packs/day: 0.5 packs/day for 26.0 years (13.0 ttl pk-yrs)    Types: Cigarettes   Smokeless tobacco: Never   Tobacco comments:    Vapes, weed,  Vaping Use   Vaping status: Some Days   Substances: Flavoring  Substance Use Topics   Alcohol use: No   Drug use: Yes    Frequency: 7.0 times per week    Types: Marijuana    Comment: last night used    No current facility-administered medications for this encounter.  Current Outpatient  Medications:    albuterol  (PROVENTIL ) (2.5 MG/3ML) 0.083% nebulizer solution, Take 3 mLs (2.5 mg total) by nebulization every 4 (four) hours as needed for wheezing or shortness of breath., Disp: 75 mL, Rfl: 0   amoxicillin -clavulanate (AUGMENTIN ) 875-125 MG tablet, Take 1 tablet by mouth every 12 (twelve) hours., Disp: 14 tablet, Rfl: 0   ibuprofen  (ADVIL ) 600 MG tablet, Take 1 tablet (600 mg total) by mouth every 8 (eight) hours as needed., Disp: 30 tablet, Rfl: 0   ipratropium (ATROVENT ) 0.06 % nasal spray, Place 2 sprays into both nostrils 4 (four) times daily., Disp: 15 mL, Rfl: 0   promethazine -dextromethorphan (PROMETHAZINE -DM) 6.25-15 MG/5ML syrup, Take 5 mLs by mouth 4 (four) times daily as needed for cough., Disp: 118 mL, Rfl: 0   Spacer/Aero-Holding Chambers (AEROCHAMBER MV) inhaler, Use as instructed, Disp: 1 each, Rfl: 1   acetaminophen  (TYLENOL ) 325 MG tablet, Take 650 mg by mouth every 6 (six) hours as needed., Disp: , Rfl:    albuterol  (VENTOLIN  HFA) 108 (90 Base) MCG/ACT inhaler, Inhale 2 puffs into the lungs every 4 (four) hours as needed., Disp: 18 g, Rfl: 0   fluticasone (FLOVENT HFA) 110 MCG/ACT inhaler, Inhale 1 puff into the lungs 2 (two) times daily., Disp: , Rfl:    gabapentin (NEURONTIN) 100  MG capsule, Take 200 mg by mouth., Disp: , Rfl:    phentermine (ADIPEX-P) 37.5 MG tablet, Take 37.5 mg by mouth., Disp: , Rfl:    rosuvastatin (CRESTOR) 5 MG tablet, Take 5 mg by mouth., Disp: , Rfl:    Semaglutide-Weight Management 0.25 MG/0.5ML SOAJ, Inject 0.25 mg into the skin once a week., Disp: , Rfl:    SUMAtriptan  (IMITREX ) 50 MG tablet, Take 1 tablet (50 mg total) by mouth every 2 (two) hours as needed for migraine. May repeat in 2 hours if headache persists or recurs., Disp: 10 tablet, Rfl: 0   [place] LIQD, by Does not apply route., Disp: , Rfl:    Ubrogepant  (UBRELVY ) 100 MG TABS, Take 1 tablet (100 mg total) by mouth daily as needed., Disp: 30 tablet, Rfl: 0  Allergies   Allergen Reactions   Doxycycline  Nausea And Vomiting   Beeswax     Bee's not the wax   Carrot [Daucus Carota]    Cats Claw [Uncaria Tomentosa (Cats Claw)]    Erythromycin Swelling   Prednisone  Itching     ROS  As noted in HPI.   Physical Exam  BP 129/89 (BP Location: Right Arm)   Pulse 89   Temp 98 F (36.7 C) (Oral)   Resp 16   Ht 5' 1.5 (1.562 m)   Wt 74.8 kg   LMP 08/08/2021 (Approximate)   SpO2 95%   BMI 30.65 kg/m   O2 sat on my examination 89 to 90%  Constitutional: Well developed, well nourished, no acute distress.  Coughing. Eyes: PERRL, EOMI, conjunctiva normal bilaterally HENT: Normocephalic, atraumatic,mucus membranes moist.  Purulent nasal congestion.  Erythematous, but not swollen turbinates.  Exquisite frontal, maxillary sinus tenderness.  Extensive postnasal drip. Respiratory: Poor air movement, occasional faint expiratory wheezing.  No rales or rhonchi.  Positive anterior, lateral chest wall tenderness Cardiovascular: Normal rate and rhythm, no murmurs, no gallops, no rubs GI: nondistended skin: No rash, skin intact Musculoskeletal: no deformities Neurologic: Alert & oriented x 3, CN III-XII grossly intact, no motor deficits, sensation grossly intact Psychiatric: Speech and behavior appropriate   ED Course   Medications  albuterol  (PROVENTIL ) (2.5 MG/3ML) 0.083% nebulizer solution 2.5 mg (2.5 mg Nebulization Given 07/23/24 0856)  ipratropium-albuterol  (DUONEB) 0.5-2.5 (3) MG/3ML nebulizer solution 3 mL (3 mLs Nebulization Given 07/23/24 0856)  dexamethasone  (DECADRON ) injection 10 mg (10 mg Intramuscular Given 07/23/24 0847)    Orders Placed This Encounter  Procedures   DG Chest 2 View    Standing Status:   Standing    Number of Occurrences:   1    Reason for Exam (SYMPTOM  OR DIAGNOSIS REQUIRED):   h/o COPD with fever cough r/o PNA   No results found for this or any previous visit (from the past 24 hours). DG Chest 2 View Result Date:  07/23/2024 EXAM: 2 VIEW(S) XRAY OF THE CHEST 07/23/2024 08:57:00 AM COMPARISON: 11/15/2023 CLINICAL HISTORY: h/o COPD with fever cough r/o PNA. Table formatting from the original note was not included.; Images from the original note were not included.; Patient reports cough and chest congestion for a week. Patient reports headache and runny nose. Patient unsure of fevers. FINDINGS: LUNGS AND PLEURA: No focal pulmonary opacity. No pulmonary edema. No pleural effusion. No pneumothorax. HEART AND MEDIASTINUM: No acute abnormality of the cardiac and mediastinal silhouettes. BONES AND SOFT TISSUES: Stable cholecystectomy clips. Thoracic degenerative changes. IMPRESSION: 1. No acute cardiopulmonary process. Electronically signed by: Helayne Hurst MD 07/23/2024 09:09 AM EDT RP Workstation:  [id number]    ED Clinical Impression  1. Acute non-recurrent pansinusitis   2. Acute cough   3. COPD exacerbation (HCC)   4. Medication refill      ED Assessment/Plan     Previous records reviewed.  As noted in HPI.  Presentation consistent with an acute pansinusitis and I am concerned for COPD exacerbation.  Patient states that prednisone  makes her itch, but she is willing to try dexamethasone .  Will give dexamethasone  10 mg IM, and a DuoNeb 5/0.5 mg due to O2 sat of 89 and 90% and reevaluate.  She is stable.  I do not think that she has impending respiratory failure at this time.  Will also check a chest x-ray.  Reviewed imaging independently.  No acute cardiopulmonary process as read by me and radiology see radiology report for details.    On reevaluation, patient states feels better.  Much improved air movement, faint expiratory wheezing throughout all lung fields..  Repeat O2 sat 95%.  Will treat as an acute pansinusitis and COPD exacerbation with Augmentin  875 mg p.o. twice daily for 7 days, regularly scheduled albuterol  inhaler with a spacer for 4 days, then as needed thereafter.  No further steroids as  dexamethasone  last 3 days.   Will also refill her albuterol  nebs.  Mucinex, saline nasal irrigation, Atrovent  nasal spray, refill of the Promethazine  DM.  Discussed  imaging, MDM, treatment plan, and plan for follow-up with patient Discussed sn/sx that should prompt return to the ED. patient agrees with plan.   Meds ordered this encounter  Medications   albuterol  (PROVENTIL ) (2.5 MG/3ML) 0.083% nebulizer solution 2.5 mg   ipratropium-albuterol  (DUONEB) 0.5-2.5 (3) MG/3ML nebulizer solution 3 mL   dexamethasone  (DECADRON ) injection 10 mg   albuterol  (VENTOLIN  HFA) 108 (90 Base) MCG/ACT inhaler    Sig: Inhale 2 puffs into the lungs every 4 (four) hours as needed.    Dispense:  18 g    Refill:  0   amoxicillin -clavulanate (AUGMENTIN ) 875-125 MG tablet    Sig: Take 1 tablet by mouth every 12 (twelve) hours.    Dispense:  14 tablet    Refill:  0   ibuprofen  (ADVIL ) 600 MG tablet    Sig: Take 1 tablet (600 mg total) by mouth every 8 (eight) hours as needed.    Dispense:  30 tablet    Refill:  0   ipratropium (ATROVENT ) 0.06 % nasal spray    Sig: Place 2 sprays into both nostrils 4 (four) times daily.    Dispense:  15 mL    Refill:  0   promethazine -dextromethorphan (PROMETHAZINE -DM) 6.25-15 MG/5ML syrup    Sig: Take 5 mLs by mouth 4 (four) times daily as needed for cough.    Dispense:  118 mL    Refill:  0   albuterol  (PROVENTIL ) (2.5 MG/3ML) 0.083% nebulizer solution    Sig: Take 3 mLs (2.5 mg total) by nebulization every 4 (four) hours as needed for wheezing or shortness of breath.    Dispense:  75 mL    Refill:  0   Spacer/Aero-Holding Chambers (AEROCHAMBER MV) inhaler    Sig: Use as instructed    Dispense:  1 each    Refill:  1      *This clinic note was created using Dragon dictation software. Therefore, there may be occasional mistakes despite careful proofreading. ?    Van Knee, MD 07/23/24 [phone]

## 2024-07-23 NOTE — ED Triage Notes (Signed)
 Patient reports cough and chest congestion for a week.  Patient reports headache and runny nose.  Patient unsure of fevers.

## 2024-07-23 NOTE — Discharge Instructions (Addendum)
 There is no pneumonia on your x-ray as read by me and by radiology.  I am treating you for an acute pansinusitis and COPD exacerbation.  Finish the Augmentin , even if you feel better. Start Mucinex or Mucinex D  to keep the mucous thin and to decongest you.   You may take 600 mg of motrin  with 1000 mg of tylenol  up to 3-4 times a day as needed for pain. This is an effective combination for pain.  Atrovent  nasal spray for the postnasal drip.  Use a NeilMed sinus rinse with distilled water as often as you want to to reduce nasal congestion. Follow the directions on the box.   2 puffs from your albuterol  inhaler or an albuterol  nebulizer every 4 hours for 2 days, then every 6 hours for 2 days, then as needed.  He can back off on the albuterol  if you start to improve sooner.  We have given you a shot of dexamethasone  here.  This is a steroid that will last for 3 days.  Promethazine  DM for cough.  Follow-up with your primary care provider if not getting better in about a week, go to the ER if you get worse  Go to www.goodrx.com to look up your medications. This will give you a list of where you can find your prescriptions at the most affordable prices. Or you can ask the pharmacist what the cash price is. This is frequently cheaper than going through insurance.

## 2024-07-24 ENCOUNTER — Other Ambulatory Visit: Payer: Self-pay | Admitting: Neurology

## 2024-07-24 DIAGNOSIS — R519 Headache, unspecified: Secondary | ICD-10-CM

## 2024-07-24 DIAGNOSIS — E538 Deficiency of other specified B group vitamins: Secondary | ICD-10-CM

## 2024-07-28 ENCOUNTER — Ambulatory Visit
Admission: RE | Admit: 2024-07-28 | Discharge: 2024-07-28 | Disposition: A | Discharge status: Home / Self Care | Source: Ambulatory Visit | Attending: Neurology | Admitting: Neurology

## 2024-07-28 DIAGNOSIS — E538 Deficiency of other specified B group vitamins: Secondary | ICD-10-CM | POA: Diagnosis present

## 2024-07-28 DIAGNOSIS — R519 Headache, unspecified: Secondary | ICD-10-CM | POA: Insufficient documentation

## 2024-07-28 DIAGNOSIS — E6609 Other obesity due to excess calories: Secondary | ICD-10-CM | POA: Insufficient documentation

## 2024-08-07 ENCOUNTER — Ambulatory Visit: Admitting: Student in an Organized Health Care Education/Training Program

## 2024-08-07 ENCOUNTER — Ambulatory Visit
Admission: RE | Admit: 2024-08-07 | Discharge: 2024-08-07 | Disposition: A | Discharge status: Home / Self Care | Source: Ambulatory Visit | Attending: Student in an Organized Health Care Education/Training Program | Admitting: Student in an Organized Health Care Education/Training Program

## 2024-08-07 ENCOUNTER — Encounter: Payer: Self-pay | Admitting: Student in an Organized Health Care Education/Training Program

## 2024-08-07 VITALS — BP 149/77 | HR 74 | Temp 97.1°F | Resp 15 | Ht 61.0 in | Wt 160.0 lb

## 2024-08-07 DIAGNOSIS — G894 Chronic pain syndrome: Secondary | ICD-10-CM | POA: Insufficient documentation

## 2024-08-07 DIAGNOSIS — M461 Sacroiliitis, not elsewhere classified: Secondary | ICD-10-CM | POA: Insufficient documentation

## 2024-08-07 DIAGNOSIS — M533 Sacrococcygeal disorders, not elsewhere classified: Secondary | ICD-10-CM | POA: Diagnosis not present

## 2024-08-07 DIAGNOSIS — Z79899 Other long term (current) drug therapy: Secondary | ICD-10-CM | POA: Diagnosis not present

## 2024-08-07 DIAGNOSIS — G5703 Lesion of sciatic nerve, bilateral lower limbs: Secondary | ICD-10-CM | POA: Diagnosis not present

## 2024-08-07 DIAGNOSIS — G4486 Cervicogenic headache: Secondary | ICD-10-CM

## 2024-08-07 MED ORDER — ROPIVACAINE HCL 2 MG/ML IJ SOLN
18.0000 mL | Freq: Once | INTRAMUSCULAR | Status: AC
  Administered 2024-08-07: 18 mL via PERINEURAL

## 2024-08-07 MED ORDER — METHYLPREDNISOLONE ACETATE 80 MG/ML IJ SUSP
80.0000 mg | Freq: Once | INTRAMUSCULAR | Status: AC
  Administered 2024-08-07: 80 mg
  Filled 2024-08-07: qty 1

## 2024-08-07 MED ORDER — LIDOCAINE HCL 2 % IJ SOLN
20.0000 mL | Freq: Once | INTRAMUSCULAR | Status: AC
  Administered 2024-08-07: 400 mg

## 2024-08-07 MED ORDER — LACTATED RINGERS IV SOLN: Freq: Once | INTRAVENOUS | Status: AC

## 2024-08-07 MED ORDER — LIDOCAINE HCL 2 % IJ SOLN
INTRAMUSCULAR | Status: AC
  Filled 2024-08-07: qty 20

## 2024-08-07 MED ORDER — DEXAMETHASONE SOD PHOSPHATE PF 10 MG/ML IJ SOLN
10.0000 mg | Freq: Once | INTRAMUSCULAR | Status: AC
  Administered 2024-08-07: 10 mg

## 2024-08-07 MED ORDER — MIDAZOLAM HCL (PF) 2 MG/2ML IJ SOLN
0.5000 mg | Freq: Once | INTRAMUSCULAR | Status: AC
  Administered 2024-08-07: 2 mg via INTRAVENOUS

## 2024-08-07 MED ORDER — MIDAZOLAM HCL 2 MG/2ML IJ SOLN
INTRAMUSCULAR | Status: AC
  Filled 2024-08-07: qty 2

## 2024-08-07 MED ORDER — ROPIVACAINE HCL 2 MG/ML IJ SOLN
INTRAMUSCULAR | Status: AC
  Filled 2024-08-07: qty 20

## 2024-08-07 MED ORDER — IOHEXOL 180 MG/ML  SOLN
10.0000 mL | Freq: Once | INTRAMUSCULAR | Status: AC
  Administered 2024-08-07: 10 mL via INTRA_ARTICULAR
  Filled 2024-08-07: qty 20

## 2024-08-07 NOTE — Progress Notes (Signed)
 PROVIDER NOTE: Interpretation of information contained herein should be left to medically-trained personnel. Specific patient instructions are provided elsewhere under Patient Instructions section of medical record. This document was created in part using STT-dictation technology, any transcriptional errors that may result from this process are unintentional.  Patient: Marilyn Hendricks Type: Established DOB: Dec 04, 1975 MRN: [id number] PCP: Harvey Gaetana CROME, NP  Service: Procedure DOS: 08/07/2024 Setting: Ambulatory Location: Ambulatory outpatient facility Delivery: Face-to-face Provider: Wallie Sherry, MD Specialty: Interventional Pain Management Specialty designation: 09 Location: Outpatient facility Ref. Prov.: Sherry Wallie, MD       Interventional Therapy   Procedure: Sacroiliac Joint Steroid Injection #1 and Bilateral Piriformis TPI, and cervical thoracic TPI Laterality: Bilateral     Level: PIIS (Posterior Inferior Iliac Spine)  Target: Interarticular sacroiliac joint. Location: Medial to the postero-medial edge of iliac spine. Region: Lumbosacral-sacrococcygeal. Approach: Inferior postero-medial percutaneous approach. Type of procedure: Percutaneous joint injection.  Imaging: Fluoroscopy-guided Non-spinal ([id number]) Anesthesia: Local anesthesia (1-2% Lidocaine ) DOS: 08/07/2024  Performed by: Wallie Sherry, MD  Purpose: Diagnostic/Therapeutic Indications: Sacroiliac joint pain in the lower back and hip area severe enough to impact quality of life or function. Rationale (medical necessity): procedure needed and proper for the diagnosis and/or treatment of Marilyn Hendricks's medical symptoms and needs. 1. Piriformis syndrome of both sides   2. Sacroiliac joint pain   3. SI joint arthritis   4. Chronic pain syndrome    NAS-11 Pain score:   Pre-procedure: 10-Worst pain ever/10   Post-procedure: 2 /10      Position / Prep / Materials:  Position: Prone  Prep solution: ChloraPrep  (2% chlorhexidine gluconate and 70% isopropyl alcohol) Prep Area: Entire posterior lumbosacral area  Materials:  Tray: Block Needle(s):  Type: Spinal  Gauge (G): 22  Length: 5-in Qty: One (1) per procedure side.  H&P (Pre-op Assessment):  Marilyn Hendricks is a 48 y.o. (year old), female patient, seen today for interventional treatment. She  has a past surgical history that includes Tubal ligation and Cholecystectomy. Marilyn Hendricks has a current medication list which includes the following prescription(s): acetaminophen , albuterol , albuterol , fluticasone, gabapentin, ibuprofen , ipratropium, phentermine, promethazine -dextromethorphan, rosuvastatin, aerochamber mv, sumatriptan , turpentine, ubrelvy , amoxicillin -clavulanate, and semaglutide-weight management, and the following Facility-Administered Medications: lactated ringers . Her primarily concern today is the Back Pain (lower)  Initial Vital Signs:  Pulse/HCG Rate: 74ECG Heart Rate: 88 Temp: (!) 97.1 F (36.2 C) Resp: 16 BP: 112/81 SpO2: 93 %  BMI: Estimated body mass index is 30.23 kg/m as calculated from the following:   Height as of this encounter: 5' 1 (1.549 m).   Weight as of this encounter: 160 lb (72.6 kg).  Risk Assessment: Allergies: Reviewed. She is allergic to doxycycline , beeswax, carrot [daucus carota], cats claw [uncaria tomentosa (cats claw)], erythromycin, and prednisone .  Allergy Precautions: None required Coagulopathies: Reviewed. None identified.  Blood-thinner therapy: None at this time Active Infection(s): Reviewed. None identified. Marilyn Hendricks is afebrile  Site Confirmation: Marilyn Hendricks was asked to confirm the procedure and laterality before marking the site Procedure checklist: Completed Consent: Before the procedure and under the influence of no sedative(s), amnesic(s), or anxiolytics, the patient was informed of the treatment options, risks and possible complications. To fulfill our ethical and legal obligations, as  recommended by the American Medical Association's Code of Ethics, I have informed the patient of my clinical impression; the nature and purpose of the treatment or procedure; the risks, benefits, and possible complications of the intervention; the alternatives, including doing nothing; the risk(s) and benefit(s) of  the alternative treatment(s) or procedure(s); and the risk(s) and benefit(s) of doing nothing. The patient was provided information about the general risks and possible complications associated with the procedure. These may include, but are not limited to: failure to achieve desired goals, infection, bleeding, organ or nerve damage, allergic reactions, paralysis, and death. In addition, the patient was informed of those risks and complications associated to the procedure, such as failure to decrease pain; infection; bleeding; organ or nerve damage with subsequent damage to sensory, motor, and/or autonomic systems, resulting in permanent pain, numbness, and/or weakness of one or several areas of the body; allergic reactions; (i.e.: anaphylactic reaction); and/or death. Furthermore, the patient was informed of those risks and complications associated with the medications. These include, but are not limited to: allergic reactions (i.e.: anaphylactic or anaphylactoid reaction(s)); adrenal axis suppression; blood sugar elevation that in diabetics may result in ketoacidosis or comma; water retention that in patients with history of congestive heart failure may result in shortness of breath, pulmonary edema, and decompensation with resultant heart failure; weight gain; swelling or edema; medication-induced neural toxicity; particulate matter embolism and blood vessel occlusion with resultant organ, and/or nervous system infarction; and/or aseptic necrosis of one or more joints. Finally, the patient was informed that Medicine is not an exact science; therefore, there is also the possibility of unforeseen or  unpredictable risks and/or possible complications that may result in a catastrophic outcome. The patient indicated having understood very clearly. We have given the patient no guarantees and we have made no promises. Enough time was given to the patient to ask questions, all of which were answered to the patient's satisfaction. Ms. Lightsey has indicated that she wanted to continue with the procedure. Attestation: I, the ordering provider, attest that I have discussed with the patient the benefits, risks, side-effects, alternatives, likelihood of achieving goals, and potential problems during recovery for the procedure that I have provided informed consent. Date  Time: 08/07/2024  9:05 AM  Pre-Procedure Preparation:  Monitoring: As per clinic protocol. Respiration, ETCO2, SpO2, BP, heart rate and rhythm monitor placed and checked for adequate function Safety Precautions: Patient was assessed for positional comfort and pressure points before starting the procedure. Time-out: I initiated and conducted the Time-out before starting the procedure, as per protocol. The patient was asked to participate by confirming the accuracy of the Time Out information. Verification of the correct person, site, and procedure were performed and confirmed by me, the nursing staff, and the patient. Time-out conducted as per Joint Commission's Universal Protocol (UP.01.01.01). Time: 1016 Start Time: 1016 hrs.  Description/Narrative of Procedure:          Start Time: 1016 hrs.  Rationale (medical necessity): procedure needed and proper for the diagnosis and/or treatment of the patient's medical symptoms and needs. Procedural Technique Safety Precautions: Aspiration looking for blood return was conducted prior to all injections. At no point did we inject any substances, as a needle was being advanced. No attempts were made at seeking any paresthesias. Safe injection practices and needle disposal techniques used.  Medications properly checked for expiration dates. SDV (single dose vial) medications used. Description of the Procedure: Protocol guidelines were followed. The patient was assisted into a comfortable position. The target area was identified and the area prepped in the usual manner. Skin & deeper tissues infiltrated with local anesthetic. Appropriate amount of time allowed to pass for local anesthetics to take effect. The procedure needles were then advanced to the target area. Proper needle placement secured. Negative  aspiration confirmed. Solution injected in intermittent fashion, asking for systemic symptoms every 0.5cc of injectate. The needles were then removed and the area cleansed, making sure to leave some of the prepping solution back to take advantage of its long term bactericidal properties.  Technical description of procedure:  Fluoroscopy using a posterior anterior 45 degree angle from the midline aiming at the anterolateral aspect of the patient was used to find a direct path into the sacroiliac joint, the superior medial to posterior superior iliac spine.  The skin was marked where the desired target and the skin infiltrated with local anesthetics.  The procedure needle was then advanced until the joint was entered.  Once inside of the joint, we then proceeded to inject the desired solution.  10 cc solution made of 9 cc of 0.2% ropivacaine , 1 cc of methylprednisolone , 80 mg/cc.  5 cc injected into the left SI joint, 5 cc injected into the right SI joint.   Afterwards a right & left piriformis trigger point injection was done 1 cm inferior, 1 cm deep, 1 cm lateral to the inferior fissure of the SI joint.  Contrast was injected to confirm piriformis muscle striation.  While injecting, patient did not complain of any pain radiating down her leg.  10 cc solution consisting of 9 cc of 0.2% lidocaine , 1 cc of Decadron  10 mg/cc.  5 cc injected into the left piriformis, 5 cc injected into the right  piriformis.   Afterwards cervical thoracic trigger point injections were done with 0.5 to 1 cc of 0.2% ropivacaine  injected in the cervical thoracic trigger point  Vitals:   08/07/24 1015 08/07/24 1022 08/07/24 1034 08/07/24 1047  BP: (!) 121/96 (!) 120/97 113/87 (!) 149/77  Pulse:      Resp: 18 15    Temp:      TempSrc:      SpO2: 99% 99% 100% 100%  Weight:      Height:         End Time: 1022 hrs.  Imaging Guidance (Non-Spinal):          Type of Imaging Technique: Fluoroscopy Guidance (Non-Spinal) Indication(s): Fluoroscopy guidance for needle placement to enhance accuracy in procedures requiring precise needle localization for targeted delivery of medication in or near specific anatomical locations not easily accessible without such real-time imaging assistance. Exposure Time: Please see nurses notes. Contrast: None used. Fluoroscopic Guidance: I was personally present during the use of fluoroscopy. Tunnel Vision Technique used to obtain the best possible view of the target area. Parallax error corrected before commencing the procedure. Direction-depth-direction technique used to introduce the needle under continuous pulsed fluoroscopy. Once target was reached, antero-posterior, oblique, and lateral fluoroscopic projection used confirm needle placement in all planes. Images permanently stored in EMR. Interpretation: No contrast injected. I personally interpreted the imaging intraoperatively. Adequate needle placement confirmed in multiple planes. Permanent images saved into the patient's record.  Post-operative Assessment:  Post-procedure Vital Signs:  Pulse/HCG Rate: 7462 Temp: (!) 97.1 F (36.2 C) Resp: 15 BP: (!) 149/77 SpO2: 100 %  EBL: None  Complications: No immediate post-treatment complications observed by team, or reported by patient.  Note: The patient tolerated the entire procedure well. A repeat set of vitals were taken after the procedure and the patient was  kept under observation following institutional policy, for this type of procedure. Post-procedural neurological assessment was performed, showing return to baseline, prior to discharge. The patient was provided with post-procedure discharge instructions, including a section on how to identify  potential problems. Should any problems arise concerning this procedure, the patient was given instructions to immediately contact us , at any time, without hesitation. In any case, we plan to contact the patient by telephone for a follow-up status report regarding this interventional procedure.  Comments:  No additional relevant information.  Plan of Care (POC)  Orders:  Orders Placed This Encounter  Procedures   DG PAIN CLINIC C-ARM 1-60 MIN NO REPORT    Intraoperative interpretation by procedural physician at [facility].    Standing Status:   Standing    Number of Occurrences:   1    Reason for exam::   Assistance in needle guidance and placement for procedures requiring needle placement in or near specific anatomical locations not easily accessible without such assistance.    Medications ordered for procedure: Meds ordered this encounter  Medications   iohexol  (OMNIPAQUE ) 180 MG/ML injection 10 mL    Must be Myelogram-compatible. If not available, you may substitute with a water-soluble, non-ionic, hypoallergenic, myelogram-compatible radiological contrast medium.   lidocaine  (XYLOCAINE ) 2 % (with pres) injection 400 mg   lactated ringers  infusion   midazolam  PF (VERSED ) injection 0.5-2 mg    Make sure Flumazenil is available in the pyxis when using this medication. If oversedation occurs, administer 0.2 mg IV over 15 sec. If after 45 sec no response, administer 0.2 mg again over 1 min; may repeat at 1 min intervals; not to exceed 4 doses (1 mg)   ropivacaine  (PF) 2 mg/mL (0.2%) (NAROPIN ) injection 18 mL   methylPREDNISolone  acetate (DEPO-MEDROL ) injection 80 mg   dexamethasone   (DECADRON ) injection 10 mg   Medications administered: We administered iohexol , lidocaine , lactated ringers , midazolam  PF, ropivacaine  (PF) 2 mg/mL (0.2%), methylPREDNISolone  acetate, and dexamethasone .  See the medical record for exact dosing, route, and time of administration.    BL L3-L5 RFA 09/27/23, 03/27/24 Bilateral SI joint and piriformis injection 08/07/2024 work Cervical thoracic TPI 08/07/2024     Follow-up plan:   Return in about 5 weeks (around 09/11/2024) for PPE, F2F, Seema.     Recent Visits Date Type Provider Dept  07/04/24 Office Visit Marcelino Nurse, MD [facility]  06/05/24 Office Visit Patel, Seema K, NP [facility]  Showing recent visits within past 90 days and meeting all other requirements Today's Visits Date Type Provider Dept  08/07/24 Procedure visit Marcelino Nurse, MD [facility]  Showing today's visits and meeting all other requirements Future Appointments Date Type Provider Dept  09/05/24 Appointment Patel, Seema K, NP [facility]  Showing future appointments within next 90 days and meeting all other requirements   Disposition: Discharge home  Discharge (Date  Time): 08/07/2024; 1053 hrs.   Primary Care Physician: Harvey Gaetana CROME, NP Location: [facility] Note by: Nurse Marcelino, MD (TTS technology used. I apologize for any typographical errors that were not detected and corrected.) Date: 08/07/2024; Time: 11:06 AM  Disclaimer:  Medicine is not an visual merchandiser. The only guarantee in medicine is that nothing is guaranteed. It is important to note that the decision to proceed with this intervention was based on the information collected from the patient. The Data and conclusions were drawn from the patient's questionnaire, the interview, and the physical examination. Because the information was provided in large part by the patient, it cannot be guaranteed that it has not been  purposely or unconsciously manipulated. Every effort has been made to obtain as much relevant data as possible for this  evaluation. It is important to note that the conclusions that lead to this procedure are derived in large part from the available data. Always take into account that the treatment will also be dependent on availability of resources and existing treatment guidelines, considered by other Pain Management Practitioners as being common knowledge and practice, at the time of the intervention. For Medico-Legal purposes, it is also important to point out that variation in procedural techniques and pharmacological choices are the acceptable norm. The indications, contraindications, technique, and results of the above procedure should only be interpreted and judged by a Board-Certified Interventional Pain Specialist with extensive familiarity and expertise in the same exact procedure and technique.

## 2024-08-07 NOTE — Patient Instructions (Signed)

## 2024-08-08 ENCOUNTER — Telehealth: Payer: Self-pay

## 2024-08-09 NOTE — Progress Notes (Signed)
 Patient received 4/4 weekly b12 and tolerated well

## 2024-08-14 ENCOUNTER — Ambulatory Visit

## 2024-09-01 NOTE — Progress Notes (Deleted)
 PROVIDER NOTE: Interpretation of information contained herein should be left to medically-trained personnel. Specific patient instructions are provided elsewhere under Patient Instructions section of medical record. This document was created in part using AI and STT-dictation technology, any transcriptional errors that may result from this process are unintentional.  Patient: Marilyn Hendricks  Service: E/M   PCP: Harvey Gaetana CROME, NP  DOB: 1976-09-28  DOS: 09/05/2024  Provider: Emmy MARLA Blanch, NP  MRN: [id number]  Delivery: Face-to-face  Specialty: Interventional Pain Management  Type: Established Patient  Setting: Ambulatory outpatient facility  Specialty designation: 09  Referring Prov.: Harvey Gaetana CROME, NP  Location: Outpatient office facility       History of present illness (HPI) [name], a 48 y.o. year old female, is here today because of her No primary diagnosis found.. Marilyn Hendricks primary complain today is No chief complaint on file.  Pertinent problems: Marilyn Hendricks has Obstructive chronic bronchitis without exacerbation (HCC); Tobacco abuse; Marijuana abuse; Lumbar spondylosis; Cervical facet joint syndrome; Cervical radicular pain; Chronic pain syndrome; Occipital neuralgia of right side; Sacroiliac joint pain; SI joint arthritis; Piriformis syndrome of both sides; and Class 1 obesity due to excess calories with serious comorbidity and body mass index (BMI) of 30.0 to 30.9 in adult on their pertinent problem list.  Pain Assessment: Severity of   is reported as a  /10. Location:    / . Onset:  . Quality:  . Timing:  . Modifying factor(s):  SABRA Vitals:  vitals were not taken for this visit.  BMI: Estimated body mass index is 30.23 kg/m as calculated from the following:   Height as of 08/07/24: 5' 1 (1.549 m).   Weight as of 08/07/24: 160 lb (72.6 kg).  Last encounter: 06/05/2024. Last procedure: Visit date not found.  Reason for encounter:  *** .   Discussed the use of AI  scribe software for clinical note transcription with the patient, who gave verbal consent to proceed.  History of Present Illness          Procedure Procedure: Sacroiliac Joint Steroid Injection #1 and Bilateral Piriformis TPI, and cervical thoracic TPI Laterality: Bilateral     Level: PIIS (Posterior Inferior Iliac Spine)  Target: Interarticular sacroiliac joint. Location: Medial to the postero-medial edge of iliac spine. Region: Lumbosacral-sacrococcygeal. Approach: Inferior postero-medial percutaneous approach. Type of procedure: Percutaneous joint injection.   Imaging: Fluoroscopy-guided Non-spinal ([id number]) Anesthesia: Local anesthesia (1-2% Lidocaine ) DOS: 08/07/2024  Performed by: Wallie Sherry, MD   Purpose: Diagnostic/Therapeutic Indications: Sacroiliac joint pain in the lower back and hip area severe enough to impact quality of life or function. Rationale (medical necessity): procedure needed and proper for the diagnosis and/or treatment of Marilyn Hendricks's medical symptoms and needs. 1. Piriformis syndrome of both sides   2. Sacroiliac joint pain   3. SI joint arthritis   4. Chronic pain syndrome     NAS-11 Pain score:        Pre-procedure: 10-Worst pain ever/10        Post-procedure: 2 /10 Post-Procedure Evaluation   Effectiveness:  Initial hour after procedure: 100 % . Subsequent 4-6 hours post-procedure: 100 % . Analgesia past initial 6 hours: 100 % (100% right, 50% left) . Ongoing improvement:  Analgesic:  Marilyn Hendricks underwent a therapeutic/palliative lumbar facet radiofrequency ablation (RFA) on March 27, 2024.  She reported 100% pain relief and functional improvement bilaterally during the local anesthetic phase.  Since the procedure, she continues to experience 100% pain relief and functional  improvement on the right side and approximately 50% pain relief and functional improvement on the left side. Function: Marilyn Hendricks reports improvement in function ROM: Ms.  Hendricks reports improvement in ROM  Effectiveness:  Initial hour after procedure:   ***. Subsequent 4-6 hours post-procedure:   ***. Analgesia past initial 6 hours:   ***. Ongoing improvement:  Analgesic:  *** Function: {Blank single:19197::No benefit,No improvement,Back to baseline,Transient improvement,Marilyn Hendricks reports improvement in function,Somewhat improved,Minimal improvement,   ***   } ROM: {Blank single:19197::No benefit,No improvement,Back to baseline,Transient improvement,Marilyn Hendricks reports improvement in ROM,Somewhat improved,Minimal improvement,   ***   } Interpretation: ***  Pharmacotherapy Assessment   Monitoring: [place] PMP: PDMP not reviewed this encounter.       Pharmacotherapy: No side-effects or adverse reactions reported. Compliance: No problems identified. Effectiveness: Clinically acceptable.  No notes on file  UDS:  No results found for: SUMMARY  No results found for: CBDTHCR No results found for: D8THCCBX No results found for: D9THCCBX  ROS  Constitutional: Denies any fever or chills Gastrointestinal: No reported hemesis, hematochezia, vomiting, or acute GI distress Musculoskeletal: Denies any acute onset joint swelling, redness, loss of ROM, or weakness Neurological: No reported episodes of acute onset apraxia, aphasia, dysarthria, agnosia, amnesia, paralysis, loss of coordination, or loss of consciousness  Medication Review  AeroChamber MV, SUMAtriptan , Turpentine, Ubrogepant , acetaminophen , albuterol , amoxicillin -clavulanate, fluticasone, gabapentin, ibuprofen , ipratropium, phentermine, promethazine -dextromethorphan, rosuvastatin, and semaglutide-weight management  History Review  Allergy: Marilyn Hendricks is allergic to doxycycline , beeswax, carrot [daucus carota], cats claw [uncaria tomentosa (cats claw)], erythromycin, and prednisone . Drug: Marilyn Hendricks  reports current drug use. Frequency: 7.00 times per week. Drug:  Marijuana. Alcohol:  reports no history of alcohol use. Tobacco:  reports that she has been smoking cigarettes. She has a 13 pack-year smoking history. She has never used smokeless tobacco. Social: Marilyn Hendricks  reports that she has been smoking cigarettes. She has a 13 pack-year smoking history. She has never used smokeless tobacco. She reports current drug use. Frequency: 7.00 times per week. Drug: Marijuana. She reports that she does not drink alcohol. Medical:  has a past medical history of ETD (eustachian tube dysfunction) and GERD (gastroesophageal reflux disease). Surgical: Ms. Limon  has a past surgical history that includes Tubal ligation and Cholecystectomy. Family: family history includes Asthma in her mother; Breast cancer (age of onset: 43) in her mother; Cancer in her maternal grandfather; Esophagitis in her father.  Laboratory Chemistry Profile   Renal Lab Results  Component Value Date   BUN 13 11/02/2022   CREATININE 0.87 11/02/2022   GFRAA >60 10/29/2019   GFRNONAA >60 11/02/2022    Hepatic Lab Results  Component Value Date   AST 28 11/02/2022   ALT 51 (H) 11/02/2022   ALBUMIN 4.0 11/02/2022   ALKPHOS 75 11/02/2022   LIPASE 18 10/29/2019    Electrolytes Lab Results  Component Value Date   NA 135 11/02/2022   K 3.9 11/02/2022   CL 100 11/02/2022   CALCIUM 9.0 11/02/2022    Bone No results found for: VD25OH, VD125OH2TOT, CI6874NY7, CI7874NY7, 25OHVITD1, 25OHVITD2, 25OHVITD3, TESTOFREE, TESTOSTERONE  Inflammation (CRP: Acute Phase) (ESR: Chronic Phase) Lab Results  Component Value Date   CRP 1.8 (H) 11/02/2022         Note: Above Lab results reviewed.  Recent Imaging Review  DG PAIN CLINIC C-ARM 1-60 MIN NO REPORT Fluoro was used, but no Radiologist interpretation will be provided.  Please refer to NOTES tab for provider progress note. Note: Reviewed  Physical Exam  Vitals: LMP 08/08/2021 (Approximate)  BMI: Estimated body  mass index is 30.23 kg/m as calculated from the following:   Height as of 08/07/24: 5' 1 (1.549 m).   Weight as of 08/07/24: 160 lb (72.6 kg). Ideal: Patient weight not recorded General appearance: Well nourished, well developed, and well hydrated. In no apparent acute distress Mental status: Alert, oriented x 3 (person, place, & time)       Respiratory: No evidence of acute respiratory distress Eyes: PERLA   Assessment   Diagnosis Status  No diagnosis found. Controlled Controlled Controlled   Updated Problems: Problem  Class 1 Obesity Due to Excess Calories With Serious Comorbidity and Body Mass Index (Bmi) of 30.0 to 30.9 in Adult  Obstructive Chronic Bronchitis Without Exacerbation (Hcc)   03/2014 PFT> Ratio 80% FEV 2.75L (110% pred, 0% change with BD), TLC 4.62L (104% pred), DLCO 16.6 (75% pred)  03/2014 PFT> Ratio 80% FEV 2.75L (110% pred, 0% change with BD), TLC 4.62L (104% pred), DLCO 16.6 (75% pred)   Last Assessment & Plan:   Her chronic bronchitis is directly related to her ongoing tobacco and marijuana use. It does not sound like she has significant postnasal drip which is contributing to this problem. She may have acid reflux.  Is not clear to me if she has COPD or asthma this certainly she is at increased risk because of her ongoing tobacco use. Further, her symptoms could be consistent with this.   Plan:  -I advised her at length quit smoking  -Full pulmonary function test  -Trial Breo  -GERD lifestyle modification changes  -Followup one month   B12 Deficiency  Mixed Hyperlipidemia    Plan of Care  Problem-specific:  Assessment and Plan            Ms. CLORIS FLIPPO has a current medication list which includes the following long-term medication(s): albuterol , albuterol , fluticasone, gabapentin, ipratropium, phentermine, rosuvastatin, and sumatriptan .  Pharmacotherapy (Medications Ordered): No orders of the defined types were placed in this  encounter.  Orders:  No orders of the defined types were placed in this encounter.    {There is no content from the last Plan section.}   No follow-ups on file.    Recent Visits Date Type Provider Dept  08/07/24 Procedure visit Marcelino Nurse, MD [facility]  07/04/24 Office Visit Marcelino Nurse, MD [facility]  06/05/24 Office Visit Mayzee Reichenbach K, NP [facility]  Showing recent visits within past 90 days and meeting all other requirements Future Appointments Date Type Provider Dept  09/05/24 Appointment Masiyah Jorstad K, NP [facility]  Showing future appointments within next 90 days and meeting all other requirements  I discussed the assessment and treatment plan with the patient. The patient was provided an opportunity to ask questions and all were answered. The patient agreed with the plan and demonstrated an understanding of the instructions.  Patient advised to call back or seek an in-person evaluation if the symptoms or condition worsens.  Duration of encounter: *** minutes.  Total time on encounter, as per AMA guidelines included both the face-to-face and non-face-to-face time personally spent by the physician and/or other qualified health care professional(s) on the day of the encounter (includes time in activities that require the physician or other qualified health care professional and does not include time in activities normally performed by clinical staff). Physician's time may include the following activities when performed: Preparing to see the patient (e.g., pre-charting review  of records, searching for previously ordered imaging, lab work, and nerve conduction tests) Review of prior analgesic pharmacotherapies. Reviewing PMP Interpreting ordered tests (e.g., lab work, imaging, nerve conduction tests) Performing post-procedure evaluations, including interpretation of diagnostic procedures Obtaining and/or reviewing separately  obtained history Performing a medically appropriate examination and/or evaluation Counseling and educating the patient/family/caregiver Ordering medications, tests, or procedures Referring and communicating with other health care professionals (when not separately reported) Documenting clinical information in the electronic or other health record Independently interpreting results (not separately reported) and communicating results to the patient/ family/caregiver Care coordination (not separately reported)  Note by: Ronnisha Felber K Vergil Burby, NP (TTS and AI technology used. I apologize for any typographical errors that were not detected and corrected.) Date: 09/05/2024; Time: 9:35 PM

## 2024-09-04 NOTE — Progress Notes (Signed)
 Patient received 4/4 weekly b12 injection and tolerated well

## 2024-09-05 ENCOUNTER — Ambulatory Visit: Admitting: Nurse Practitioner

## 2024-09-28 NOTE — Progress Notes (Signed)
 PROVIDER NOTE: Interpretation of information contained herein should be left to medically-trained personnel. Specific patient instructions are provided elsewhere under Patient Instructions section of medical record. This document was created in part using AI and STT-dictation technology, any transcriptional errors that may result from this process are unintentional.  Patient: Marilyn Hendricks  Service: E/M   PCP: Harvey Gaetana CROME, NP  DOB: [birth year]  DOS: 10/02/2024  Provider: Emmy MARLA Blanch, NP  MRN: [id number]  Delivery: Face-to-face  Specialty: Interventional Pain Management  Type: Established Patient  Setting: Ambulatory outpatient facility  Specialty designation: 09  Referring Prov.: Harvey Gaetana CROME, NP  Location: Outpatient office facility       History of present illness (HPI) [name], a 49 y.o. year old female, is here today because of her Sacroiliac joint pain [M53.3]. [name] primary complain today is Back Pain (Lumbar bilateral ), Neck Pain (Right is worse ), and Headache (Entire head hurts )  Pertinent problems: [name] has Obstructive chronic bronchitis without exacerbation (HCC); Tobacco abuse; Marijuana abuse; Lumbar spondylosis; Cervical facet joint syndrome; Cervical radicular pain; Chronic pain syndrome; Occipital neuralgia of right side; Sacroiliac joint pain; SI joint arthritis; Piriformis syndrome of both sides; Class 1 obesity due to excess calories with serious comorbidity and body mass index (BMI) of 30.0 to 30.9 in adult; and Migraine with status migrainosus on their pertinent problem list.  Pain Assessment: Severity of Chronic pain is reported as a 8 /10. Location: Back (right neck) Lower, Left, Right/? into the head, back pain down both legs, numb hands. Onset: More than a month ago. Quality: Other (Comment), Discomfort, Constant, Tingling, Numbness, Headache. Timing: Constant. Modifying factor(s): injetions for a period of time.  chiropractics. Vitals:   temporal temperature is 97.9 F (36.6 C). Her blood pressure is 112/89. Her respiration is 16 and oxygen saturation is 99%.  BMI: Estimated body mass index is 30.23 kg/m as calculated from the following:   Height as of 08/07/24: 5' 1 (1.549 m).   Weight as of 08/07/24: 160 lb (72.6 kg).  Last encounter: 06/05/2024. Last procedure: Visit date not found.  Reason for encounter: post-procedure evaluation and assessment.   [name] underwent a diagnostic/therapeutic SI joint injection and bilateral piriformis TPI and cervical thoracic TPI on August 07, 2024.  She reports initially 100% pain relief and functional improvement during local anesthetic phase, followed by sustained 100% pain relief for approximately 3 weeks and then pain returned to baseline.  Discussed the use of AI scribe software for clinical note transcription with the patient, who gave verbal consent to proceed.  History of Present Illness   Marilyn Hendricks is a 49 year old female who presents for pain management follow-up for chronic headaches and neck pain.  She experiences chronic headaches that are severe enough to cause nausea. Sumatriptan  provides significant relief, while Tylenol  is ineffective. She recently finished her last dose of sumatriptan  and requests a refill. She also uses a migraine gel, which she finds helpful in combination with sumatriptan . A CT scan of her neck, upper back, and head has been performed, with results pending.  Her neck and back pain typically returns about two and a half to three weeks after procedures, affecting her cervical and sacroiliac regions. Currently, the pain is primarily on one side and in her neck, which she describes as 'so bad'. Chiropractic adjustments have provided some relief. She has a history of procedures including bilateral TPI and cervical facet blocks, with varying degrees of  relief. A cervical occipital block in August provided more than 50% relief at the time. However, a  previous treatment involving 16 shots in her head only lasted two weeks instead of the expected six months.  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      Procedure Procedure: Sacroiliac Joint Steroid Injection #1 and Bilateral Piriformis TPI, and cervical thoracic TPI Laterality: Bilateral     Level: PIIS (Posterior Inferior Iliac Spine)  Target: Interarticular sacroiliac joint. Location: Medial to the postero-medial edge of iliac spine. Region: Lumbosacral-sacrococcygeal. Approach: Inferior postero-medial percutaneous approach. Type of procedure: Percutaneous joint injection.   Imaging: Fluoroscopy-guided Non-spinal ([id number]) Anesthesia: Local anesthesia (1-2% Lidocaine ) DOS: 08/07/2024  Performed by: Wallie Sherry, MD   Purpose: Diagnostic/Therapeutic Indications: Sacroiliac joint pain in the lower back and hip area severe enough to impact quality of life or function. Rationale (medical necessity): procedure needed and proper for the diagnosis and/or treatment of [name]'s medical symptoms and needs. 1. Piriformis syndrome of both sides   2. Sacroiliac joint pain   3. SI joint arthritis   4. Chronic pain syndrome     NAS-11 Pain score:        Pre-procedure: 10-Worst pain ever/10        Post-procedure: 2 /10  Effectiveness:  Initial hour after procedure: 100 % . Subsequent 4-6 hours post-procedure: 100 % . Analgesia past initial 6 hours: 100 % (good pain relief x 2 1/2 to 3 weeks or so and then the pain returns all at once.) . Ongoing improvement:  Analgesic:  100 % (good pain relief x 2 1/2 to 3 weeks or so and then the pain returns all at once.)  Function: [name] reports improvement in function ROM: [name] reports improvement in ROM Interpretation:  [name] underwent a  diagnostic/therapeutic SI joint injection and bilateral piriformis TPI and cervical thoracic TPI on August 07, 2024.  She reports initially 100% pain relief and functional improvement during local anesthetic phase, followed by sustained 100% pain relief for approximately 3 weeks and then pain returned to baseline. Pharmacotherapy Assessment   Sumatriptan  (Imitrex ) 50 mg tablet every 2 hours as needed for migraine headache Monitoring:  PMP: PDMP reviewed during this encounter.       Pharmacotherapy: No side-effects or adverse reactions reported. Compliance: No problems identified. Effectiveness: Clinically acceptable.  Jakie Chrissie MATSU, RN  10/02/2024  8:37 AM  Sign when Signing Visit Safety precautions to be maintained throughout the outpatient stay will include: orient to surroundings, keep bed in low position, maintain call bell within reach at all times, provide assistance with transfer out of bed and ambulation.     UDS:  No results found for: SUMMARY  No results found for: CBDTHCR No results found for: D8THCCBX No results found for: D9THCCBX  ROS  Constitutional: Denies any fever or chills Gastrointestinal: No reported hemesis, hematochezia, vomiting, or acute GI distress Musculoskeletal: Back Pain (Lumbar bilateral ), Neck Pain (Right is worse ), and Headache (Entire head hurts) Neurological: No reported episodes of acute onset apraxia, aphasia, dysarthria, agnosia, amnesia, paralysis, loss of coordination, or loss of consciousness  Medication Review  AeroChamber MV, SUMAtriptan , Turpentine, Ubrogepant , acetaminophen , albuterol , amoxicillin -clavulanate, fluticasone, gabapentin, ibuprofen , ipratropium, phentermine, promethazine -dextromethorphan, rosuvastatin, and semaglutide-weight management  History Review  Allergy: [name] is allergic to doxycycline , beeswax,  carrot [daucus carota], cats claw [uncaria tomentosa (cats claw)], erythromycin, and prednisone . Drug:  [name]  reports current drug use. Frequency: 7.00 times per week. Drug: Marijuana. Alcohol:  reports no history of alcohol use. Tobacco:  reports that she has been smoking cigarettes. She has a 13 pack-year smoking history. She has never used smokeless tobacco. Social: [name]  reports that she has been smoking cigarettes. She has a 13 pack-year smoking history. She has never used smokeless tobacco. She reports current drug use. Frequency: 7.00 times per week. Drug: Marijuana. She reports that she does not drink alcohol. Medical:  has a past medical history of ETD (eustachian tube dysfunction) and GERD (gastroesophageal reflux disease). Surgical: [name]  has a past surgical history that includes Tubal ligation and Cholecystectomy. Family: family history includes Asthma in her mother; Breast cancer (age of onset: 40) in her mother; Cancer in her maternal grandfather; Esophagitis in her father.  Laboratory Chemistry Profile   Renal Lab Results  Component Value Date   BUN 13 11/02/2022   CREATININE 0.87 11/02/2022   GFRAA >60 10/29/2019   GFRNONAA >60 11/02/2022    Hepatic Lab Results  Component Value Date   AST 28 11/02/2022   ALT 51 (H) 11/02/2022   ALBUMIN 4.0 11/02/2022   ALKPHOS 75 11/02/2022   LIPASE 18 10/29/2019    Electrolytes Lab Results  Component Value Date   NA 135 11/02/2022   K 3.9 11/02/2022   CL 100 11/02/2022   CALCIUM 9.0 11/02/2022    Bone No results found for: VD25OH, VD125OH2TOT, CI6874NY7, CI7874NY7, 25OHVITD1, 25OHVITD2, 25OHVITD3, TESTOFREE, TESTOSTERONE  Inflammation (CRP: Acute Phase) (ESR: Chronic Phase) Lab Results  Component Value Date   CRP 1.8 (H) 11/02/2022         Note: Above Lab results reviewed.  Recent Imaging Review  DG PAIN CLINIC C-ARM 1-60 MIN NO REPORT Fluoro was used, but no Radiologist interpretation will be provided.  Please refer to NOTES tab for provider progress note. Note: Reviewed         Physical Exam  Vitals: BP 112/89   Temp 97.9 F (36.6 C) (Temporal)   Resp 16   LMP 08/08/2021   SpO2 99%  BMI: Estimated body mass index is 30.23 kg/m as calculated from the following:   Height as of 08/07/24: 5' 1 (1.549 m).   Weight as of 08/07/24: 160 lb (72.6 kg). Ideal: Patient weight not recorded General appearance: Well nourished, well developed, and well hydrated. In no apparent acute distress Mental status: Alert, oriented x 3 (person, place, & time)       Respiratory: No evidence of acute respiratory distress Eyes: PERLA  Musculoskeletal: +Back Pain (Lumbar bilateral )  Neck Pain (R>L)  Headache  Assessment   Diagnosis Status  1. Sacroiliac joint pain   2. Piriformis syndrome of both sides   3. Cervical radicular pain   4. SI joint arthritis   5. Myofascial pain syndrome of lumbar spine   6. Chronic pain syndrome   7. Lumbar spondylosis   8. Cervical facet joint syndrome   9. Occipital neuralgia of right side   10. Chronic migraine without aura with status migrainosus, not intractable    Improved Improved Improved   Updated Problems: Problem  Migraine With Status Migrainosus    Plan of Care  Problem-specific:  Assessment and Plan    Chronic migraine without aura with status migrainosus Chronic migraines with status migrainosus, severe headaches sometimes with nausea. Sumatriptan  provides significant relief. Neurologist  suspects possible sleep apnea, pending sleep study. - Prescribed sumatriptan  with two refills. - Continue using migraine gel. - Await sleep study results for sleep apnea evaluation.  Cervicogenic headache Headaches potentially related to cervical spine issues. Previous cervical occipital block provided over 50% relief. Chiropractic adjustments beneficial. - Continue chiropractic adjustments as needed. - Consider repeating cervical occipital block if headaches persist.  Cervical facet joint syndrome Contributing to neck pain and  headaches. Previous cervical facet block provided significant relief. Chiropractic adjustments beneficial. - Continue chiropractic adjustments as needed. - Consider repeating cervical facet block if symptoms persist.  Occipital neuralgia, right side Previous cervical occipital block provided significant relief. - Consider repeating cervical occipital block if symptoms persist.  Sacroiliac joint pain Intermittent sacroiliac joint pain, previously managed with procedures. Current pain localized to one side. - Continue current management strategies as needed.       [name] has a current medication list which includes the following long-term medication(s): albuterol , albuterol , fluticasone, gabapentin, ipratropium, phentermine, rosuvastatin, and sumatriptan .  Pharmacotherapy (Medications Ordered): Meds ordered this encounter  Medications   SUMAtriptan  (IMITREX ) 50 MG tablet    Sig: Take 1 tablet (50 mg total) by mouth every 2 (two) hours as needed for migraine. May repeat in 2 hours if headache persists or recurs.    Dispense:  10 tablet    Refill:  2   Orders:  No orders of the defined types were placed in this encounter.       Return for (VV), (PRN), Emmy Blanch NP.    Recent Visits Date Type Provider Dept  08/07/24 Procedure visit Marcelino Nurse, MD [facility]  07/04/24 Office Visit Marcelino Nurse, MD [facility]  Showing recent visits within past 90 days and meeting all other requirements Today's Visits Date Type Provider Dept  10/02/24 Office Visit Pebbles Zeiders K, NP [facility]  Showing today's visits and meeting all other requirements Future Appointments No visits were found meeting these conditions. Showing future appointments within next 90 days and meeting all other requirements  I discussed the assessment and treatment plan with the patient. The patient was provided an opportunity to ask questions and all were answered. The  patient agreed with the plan and demonstrated an understanding of the instructions.  Patient advised to call back or seek an in-person evaluation if the symptoms or condition worsens.  I personally spent a total of 30 minutes in the care of the patient today including preparing to see the patient, getting/reviewing separately obtained history, performing a medically appropriate exam/evaluation, counseling and educating, placing orders, referring and communicating with other health care professionals, documenting clinical information in the EHR, independently interpreting results, communicating results, and coordinating care.   Note by: Taylour Lietzke K Saatvik Thielman, NP (TTS and AI technology used. I apologize for any typographical errors that were not detected and corrected.) Date: 10/02/2024; Time: 9:36 AM

## 2024-10-02 ENCOUNTER — Ambulatory Visit: Attending: Nurse Practitioner | Admitting: Nurse Practitioner

## 2024-10-02 ENCOUNTER — Encounter: Payer: Self-pay | Admitting: Nurse Practitioner

## 2024-10-02 VITALS — BP 112/89 | Temp 97.9°F | Resp 16

## 2024-10-02 DIAGNOSIS — M5481 Occipital neuralgia: Secondary | ICD-10-CM | POA: Diagnosis present

## 2024-10-02 DIAGNOSIS — M7918 Myalgia, other site: Secondary | ICD-10-CM | POA: Diagnosis present

## 2024-10-02 DIAGNOSIS — M461 Sacroiliitis, not elsewhere classified: Secondary | ICD-10-CM | POA: Diagnosis present

## 2024-10-02 DIAGNOSIS — M47812 Spondylosis without myelopathy or radiculopathy, cervical region: Secondary | ICD-10-CM | POA: Insufficient documentation

## 2024-10-02 DIAGNOSIS — G894 Chronic pain syndrome: Secondary | ICD-10-CM | POA: Diagnosis present

## 2024-10-02 DIAGNOSIS — M4722 Other spondylosis with radiculopathy, cervical region: Secondary | ICD-10-CM | POA: Diagnosis not present

## 2024-10-02 DIAGNOSIS — M5412 Radiculopathy, cervical region: Secondary | ICD-10-CM | POA: Diagnosis present

## 2024-10-02 DIAGNOSIS — M47816 Spondylosis without myelopathy or radiculopathy, lumbar region: Secondary | ICD-10-CM | POA: Insufficient documentation

## 2024-10-02 DIAGNOSIS — G43701 Chronic migraine without aura, not intractable, with status migrainosus: Secondary | ICD-10-CM | POA: Insufficient documentation

## 2024-10-02 DIAGNOSIS — G43901 Migraine, unspecified, not intractable, with status migrainosus: Secondary | ICD-10-CM | POA: Insufficient documentation

## 2024-10-02 DIAGNOSIS — M533 Sacrococcygeal disorders, not elsewhere classified: Secondary | ICD-10-CM | POA: Insufficient documentation

## 2024-10-02 DIAGNOSIS — G5703 Lesion of sciatic nerve, bilateral lower limbs: Secondary | ICD-10-CM | POA: Diagnosis present

## 2024-10-02 MED ORDER — SUMATRIPTAN SUCCINATE 50 MG PO TABS: 50.0000 mg | ORAL_TABLET | ORAL | 2 refills | Status: AC | PRN

## 2024-10-02 NOTE — Progress Notes (Signed)
 Safety precautions to be maintained throughout the outpatient stay will include: orient to surroundings, keep bed in low position, maintain call bell within reach at all times, provide assistance with transfer out of bed and ambulation.

## 2024-11-03 ENCOUNTER — Other Ambulatory Visit: Payer: Self-pay

## 2024-11-03 ENCOUNTER — Ambulatory Visit
Admission: EM | Admit: 2024-11-03 | Discharge: 2024-11-03 | Disposition: A | Discharge status: Home / Self Care | Source: Home / Self Care

## 2024-11-03 DIAGNOSIS — J310 Chronic rhinitis: Secondary | ICD-10-CM

## 2024-11-03 DIAGNOSIS — J4489 Other specified chronic obstructive pulmonary disease: Secondary | ICD-10-CM

## 2024-11-03 MED ORDER — DEXAMETHASONE SOD PHOSPHATE PF 10 MG/ML IJ SOLN
10.0000 mg | Freq: Once | INTRAMUSCULAR | Status: AC
  Administered 2024-11-03: 10 mg via INTRAMUSCULAR

## 2024-11-03 MED ORDER — METHYLPREDNISOLONE 4 MG PO TBPK: ORAL_TABLET | ORAL | 0 refills | Status: AC

## 2024-11-03 NOTE — Discharge Instructions (Addendum)
 I do not feel that you need another round of antibiotics at this time.  I suspect that your chronic rhinitis and cough are related to your smoking.  I highly encourage you to stop smoking as the smoke causes irritation of your respiratory mucosa in your upper or lower respiratory tract.  We have given you an injection of steroids to help decrease the inflammation in your respiratory tract and will start you on a Medrol  Dosepak.  You do not need to start that until tomorrow.  Continue using your albuterol  inhaler or nebulizer every 4-6 hours as needed for shortness breath and wheezing and continue to use your Flovent twice daily to help decrease pulmonary inflammation.  I encourage you to make a follow-up appointment with your primary care provider at [facility] clinic and request a referral to pulmonology for better management of your chronic bronchitis.

## 2024-11-03 NOTE — ED Triage Notes (Signed)
 Pt being seen in UC for nasal congestion, cough, sinus pressure, and fatigue for 3 months. Pt reports being on 2 different antibiotics recently. Pt reports taking tylenol  and motrin  for facial pressure/discomfort. Pt unknown of fevers.

## 2024-11-03 NOTE — ED Provider Notes (Signed)
 " [facility]    CSN: [id number] Arrival date & time: 11/03/24  0810      History   Chief Complaint Chief Complaint  Patient presents with   Nasal Congestion   Facial Pain    HPI Marilyn Hendricks is a 49 y.o. female.   HPI  49 year old female with past medical history significant for GERD, eustachian tube dysfunction, migraine headaches, and chronic pain syndrome presents for evaluation of respiratory symptoms that have been going on for last 3 months despite several rounds of antibiotics.  Past Medical History:  Diagnosis Date   ETD (eustachian tube dysfunction)    GERD (gastroesophageal reflux disease)     Patient Active Problem List   Diagnosis Date Noted   Migraine with status migrainosus 10/02/2024   Class 1 obesity due to excess calories with serious comorbidity and body mass index (BMI) of 30.0 to 30.9 in adult 07/28/2024   Sacroiliac joint pain 07/04/2024   SI joint arthritis 07/04/2024   Piriformis syndrome of both sides 07/04/2024   B12 deficiency 06/12/2024   Mixed hyperlipidemia 06/12/2024   Occipital neuralgia of right side 05/08/2024   Lumbar spondylosis 03/02/2023   Cervical facet joint syndrome 03/02/2023   Cervical radicular pain 03/02/2023   Chronic pain syndrome 03/02/2023   Prediabetes 02/17/2023   Lymphocytosis 11/02/2022   Cyst of skin 01/26/2018   Encounter for screening mammogram for breast cancer 01/26/2018   Obstructive chronic bronchitis without exacerbation (HCC) 04/09/2014   Tobacco abuse 04/09/2014   Marijuana abuse 04/09/2014    Past Surgical History:  Procedure Laterality Date   CHOLECYSTECTOMY     TUBAL LIGATION      OB History     Gravida  5   Para  3   Term      Preterm      AB  1   Living  3      SAB  1   IAB      Ectopic      Multiple  3   Live Births  3        Obstetric Comments  1st Menstrual Cycle:  12  1st Pregnancy:  14           Home Medications    Prior to Admission  medications  Medication Sig Start Date End Date Taking? Authorizing Provider  ketorolac  (TORADOL ) 10 MG tablet Take 10 mg by mouth every 8 (eight) hours as needed. 10/03/24  Yes [provider]  methylPREDNISolone  (MEDROL  DOSEPAK) 4 MG TBPK tablet Take according to the package insert. 11/03/24  Yes Bernardino Ditch, NP  ZEPBOUND 2.5 MG/0.5ML Pen Inject 2.5 mg into the skin once a week. 10/16/24  Yes [provider]  acetaminophen  (TYLENOL ) 325 MG tablet Take 650 mg by mouth every 6 (six) hours as needed.    [provider]  albuterol  (PROVENTIL ) (2.5 MG/3ML) 0.083% nebulizer solution Take 3 mLs (2.5 mg total) by nebulization every 4 (four) hours as needed for wheezing or shortness of breath. 07/23/24   Van Knee, MD  albuterol  (VENTOLIN  HFA) 108 [phone] Base) MCG/ACT inhaler Inhale 2 puffs into the lungs every 4 (four) hours as needed. 07/23/24   Mortenson, Ashley, MD  fluticasone (FLOVENT HFA) 110 MCG/ACT inhaler Inhale 1 puff into the lungs 2 (two) times daily. 12/10/23 12/09/24  [provider]  gabapentin (NEURONTIN) 100 MG capsule Take 200 mg by mouth. Patient not taking: Reported on 11/03/2024 03/23/24 03/23/25  [provider]  ibuprofen  (ADVIL )  600 MG tablet Take 1 tablet (600 mg total) by mouth every 8 (eight) hours as needed. 07/23/24   Mortenson, Ashley, MD  ipratropium (ATROVENT ) 0.06 % nasal spray Place 2 sprays into both nostrils 4 (four) times daily. 07/23/24   Mortenson, Ashley, MD  phentermine (ADIPEX-P) 37.5 MG tablet Take 37.5 mg by mouth. Patient not taking: Reported on 11/03/2024 06/27/24 08/07/24  [provider]  promethazine -dextromethorphan (PROMETHAZINE -DM) 6.25-15 MG/5ML syrup Take 5 mLs by mouth 4 (four) times daily as needed for cough. Patient not taking: Reported on 11/03/2024 07/23/24   Mortenson, Ashley, MD  rosuvastatin (CRESTOR) 5 MG tablet Take 5 mg by mouth. 06/12/24 06/12/25  [provider]  Spacer/Aero-Holding  Raguel (AEROCHAMBER MV) inhaler Use as instructed 07/23/24   Van Knee, MD  SUMAtriptan  (IMITREX ) 50 MG tablet Take 1 tablet (50 mg total) by mouth every 2 (two) hours as needed for migraine. May repeat in 2 hours if headache persists or recurs. 10/02/24   Patel, Seema K, NP  Turpentine LIQD by Does not apply route.    [provider]  Ubrogepant  (UBRELVY ) 100 MG TABS Take 1 tablet (100 mg total) by mouth daily as needed. 04/25/24   Tobie Emmy POUR, NP    Family History Family History  Problem Relation Age of Onset   Breast cancer Mother 6       double mastectomy in 1994   Asthma Mother    Esophagitis Father    Cancer Maternal Grandfather        prostate    Social History Social History[1]   Allergies   Doxycycline , Beeswax, Carrot [daucus carota], Cats claw [uncaria tomentosa (cats claw)], Erythromycin, and Prednisone    Review of Systems Review of Systems  Constitutional:  Negative for fever.  HENT:  Positive for congestion, rhinorrhea and sinus pressure. Negative for ear pain.   Respiratory:  Positive for cough, shortness of breath and wheezing.      Physical Exam Triage Vital Signs ED Triage Vitals  Encounter Vitals Group     BP      Girls Systolic BP Percentile      Girls Diastolic BP Percentile      Boys Systolic BP Percentile      Boys Diastolic BP Percentile      Pulse      Resp      Temp      Temp src      SpO2      Weight      Height      Head Circumference      Peak Flow      Pain Score      Pain Loc      Pain Education      Exclude from Growth Chart    No data found.  Updated Vital Signs BP 112/79 (BP Location: Right Arm)   Pulse 81   Temp 97.9 F (36.6 C) (Oral)   Resp 19   LMP 08/08/2021   SpO2 95%   Visual Acuity Right Eye Distance:   Left Eye Distance:   Bilateral Distance:    Right Eye Near:   Left Eye Near:    Bilateral Near:     Physical Exam Vitals and nursing note reviewed.  Constitutional:       Appearance: Normal appearance. She is not ill-appearing.  HENT:     Head: Normocephalic and atraumatic.     Nose: Congestion and rhinorrhea present.     Comments: Nasal mucosa is edematous and  erythematous with clear discharge in both nares.    Mouth/Throat:     Mouth: Mucous membranes are moist.     Pharynx: Oropharynx is clear. No oropharyngeal exudate or posterior oropharyngeal erythema.  Cardiovascular:     Rate and Rhythm: Normal rate and regular rhythm.     Pulses: Normal pulses.     Heart sounds: Normal heart sounds. No murmur heard.    No friction rub. No gallop.  Pulmonary:     Effort: Pulmonary effort is normal.     Breath sounds: Normal breath sounds. No wheezing, rhonchi or rales.  Musculoskeletal:     Cervical back: Normal range of motion and neck supple. No tenderness.  Lymphadenopathy:     Cervical: No cervical adenopathy.  Skin:    General: Skin is warm and dry.     Capillary Refill: Capillary refill takes less than 2 seconds.     Findings: No erythema or rash.  Neurological:     General: No focal deficit present.     Mental Status: She is alert and oriented to person, place, and time.      UC Treatments / Results  Labs (all labs ordered are listed, but only abnormal results are displayed) Labs Reviewed - No data to display  EKG   Radiology No results found.  Procedures Procedures (including critical care time)  Medications Ordered in UC Medications  dexamethasone  (DECADRON ) injection 10 mg (has no administration in time range)    Initial Impression / Assessment and Plan / UC Course  I have reviewed the triage vital signs and the nursing notes.  Pertinent labs & imaging results that were available during my care of the patient were reviewed by me and considered in my medical decision making (see chart for details).   Patient is a nontoxic-appearing 49 year old female presenting for evaluation of 3 months worth of respiratory symptoms as outlined  in HPI above.  She is a daily smoker but reports less than a pack per day.  She also vapes on 2 days a week due to being in school.  She has been doing this for the last 40 years.  Patient has a documented history of chronic obstructive bronchitis.  She was evaluated in this urgent care on 07/23/2024 and had a chest x-ray at that time which did not show any acute cardiopulmonary process.  She does have thoracic degenerative changes.  She was treated with a 7-day course of Augmentin  at that visit and prescribed an albuterol  inhaler and spacer also was given injection of Decadron .  Review of records in epic show that she was seen by neurology and internal medicine at [facility] clinic, her PCP, on 10/17/2024.  It is mention at that visit that she has had a previous CVA that was confirmed by MRI.  She also reported that she had been taking antibiotic she had on hand for a throat issue that she described as going out.  She was advised to stop smoking due to the history of previous CVA.  She also with diagnosis of chronic pharyngitis.  She is prescribed Flovent twice daily.  Given her physical exam findings, I do not feel she needs another round of antibiotic.  We will do a Medrol  Dosepak daily for 6 days and have staff administer 10 mg of IM Decadron  here in clinic.  I will have her continue to use her Flovent and her albuterol  to help with shortness of breath and wheezing.  I we will refill her Tessalon  Perles and  Promethazine  DM cough syrup.  I will also encourage her to quit smoking.   Final Clinical Impressions(s) / UC Diagnoses   Final diagnoses:  Chronic rhinitis  Obstructive chronic bronchitis without exacerbation (HCC)     Discharge Instructions      I do not feel that you need another round of antibiotics at this time.  I suspect that your chronic rhinitis and cough are related to your smoking.  I highly encourage you to stop smoking as the smoke causes irritation of your respiratory mucosa in  your upper or lower respiratory tract.  We have given you an injection of steroids to help decrease the inflammation in your respiratory tract and will start you on a Medrol  Dosepak.  You do not need to start that until tomorrow.  Continue using your albuterol  inhaler or nebulizer every 4-6 hours as needed for shortness breath and wheezing and continue to use your Flovent twice daily to help decrease pulmonary inflammation.  I encourage you to make a follow-up appointment with your primary care provider at [facility] clinic and request a referral to pulmonology for better management of your chronic bronchitis.     ED Prescriptions     Medication Sig Dispense Auth. Provider   methylPREDNISolone  (MEDROL  DOSEPAK) 4 MG TBPK tablet Take according to the package insert. 1 each Bernardino Ditch, NP      PDMP not reviewed this encounter.    [1]  Social History Tobacco Use   Smoking status: Every Day    Current packs/day: 0.50    Average packs/day: 0.5 packs/day for 26.0 years (13.0 ttl pk-yrs)    Types: Cigarettes   Smokeless tobacco: Never   Tobacco comments:    Vapes, weed,  Vaping Use   Vaping status: Some Days   Substances: Flavoring  Substance Use Topics   Alcohol use: No   Drug use: Yes    Frequency: 7.0 times per week    Types: Marijuana    Comment: pt reports not everyday     Bernardino Ditch, NP 11/03/24 0847  "
